# Patient Record
Sex: Male | Born: 1970 | Race: Black or African American | Hispanic: No | Marital: Married | Smoking: Former smoker
Health system: Southern US, Community
[De-identification: ages and names within clinical notes are randomized; demographics above are authoritative.]

## PROBLEM LIST (undated history)

## (undated) DIAGNOSIS — M549 Dorsalgia, unspecified: Secondary | ICD-10-CM

## (undated) DIAGNOSIS — I10 Essential (primary) hypertension: Secondary | ICD-10-CM

## (undated) DIAGNOSIS — E669 Obesity, unspecified: Secondary | ICD-10-CM

## (undated) DIAGNOSIS — L509 Urticaria, unspecified: Secondary | ICD-10-CM

## (undated) DIAGNOSIS — G4730 Sleep apnea, unspecified: Secondary | ICD-10-CM

## (undated) DIAGNOSIS — R7303 Prediabetes: Secondary | ICD-10-CM

## (undated) DIAGNOSIS — N289 Disorder of kidney and ureter, unspecified: Secondary | ICD-10-CM

## (undated) DIAGNOSIS — R0602 Shortness of breath: Secondary | ICD-10-CM

## (undated) DIAGNOSIS — E785 Hyperlipidemia, unspecified: Secondary | ICD-10-CM

## (undated) HISTORY — PX: TONSILLECTOMY: SUR1361

## (undated) HISTORY — DX: Prediabetes: R73.03

## (undated) HISTORY — DX: Disorder of kidney and ureter, unspecified: N28.9

## (undated) HISTORY — DX: Sleep apnea, unspecified: G47.30

## (undated) HISTORY — DX: Urticaria, unspecified: L50.9

## (undated) HISTORY — DX: Dorsalgia, unspecified: M54.9

## (undated) HISTORY — DX: Shortness of breath: R06.02

---

## 2003-12-22 ENCOUNTER — Emergency Department: Payer: Self-pay | Admitting: Emergency Medicine

## 2003-12-24 ENCOUNTER — Emergency Department: Payer: Self-pay | Admitting: Emergency Medicine

## 2006-03-29 ENCOUNTER — Emergency Department (HOSPITAL_COMMUNITY): Admission: EM | Admit: 2006-03-29 | Discharge: 2006-03-30 | Payer: Self-pay | Admitting: Emergency Medicine

## 2013-01-24 ENCOUNTER — Encounter (HOSPITAL_COMMUNITY): Payer: Self-pay | Admitting: Emergency Medicine

## 2013-01-24 ENCOUNTER — Emergency Department (INDEPENDENT_AMBULATORY_CARE_PROVIDER_SITE_OTHER)
Admission: EM | Admit: 2013-01-24 | Discharge: 2013-01-24 | Disposition: A | Discharge status: Home / Self Care | Payer: BC Managed Care – PPO | Source: Home / Self Care

## 2013-01-24 DIAGNOSIS — R059 Cough, unspecified: Secondary | ICD-10-CM

## 2013-01-24 DIAGNOSIS — R05 Cough: Secondary | ICD-10-CM

## 2013-01-24 DIAGNOSIS — J069 Acute upper respiratory infection, unspecified: Secondary | ICD-10-CM

## 2013-01-24 HISTORY — DX: Essential (primary) hypertension: I10

## 2013-01-24 MED ORDER — GUAIFENESIN-CODEINE 100-10 MG/5ML PO SYRP
5.0000 mL | ORAL_SOLUTION | ORAL | Status: DC | PRN
Start: 2013-01-24 — End: 2015-04-21

## 2013-01-24 NOTE — ED Provider Notes (Signed)
Medical screening examination/treatment/procedure(s) were performed by resident physician or non-physician practitioner and as supervising physician I was immediately available for consultation/collaboration.   Madelline Eshbach DOUGLAS MD.   Britany Callicott D Merrit Friesen, MD 01/24/13 1424 

## 2013-01-24 NOTE — ED Provider Notes (Signed)
CSN: [id number]     Arrival date & time 01/24/13  1042 History   First MD Initiated Contact with Patient 01/24/13 1153     Chief Complaint  Patient presents with  . Cough   (Consider location/radiation/quality/duration/timing/severity/associated sxs/prior Treatment) HPI Comments: Cough for 1 week   Past Medical History  Diagnosis Date  . Hypertension    History reviewed. No pertinent past surgical history. History reviewed. No pertinent family history. History  Substance Use Topics  . Smoking status: Never Smoker   . Smokeless tobacco: Not on file  . Alcohol Use: Not on file    Review of Systems  Constitutional: Negative for fever, diaphoresis, activity change and fatigue.  HENT: Positive for postnasal drip and trouble swallowing. Negative for ear pain, facial swelling, rhinorrhea and sore throat.   Eyes: Negative for pain, discharge and redness.  Respiratory: Positive for cough. Negative for chest tightness and shortness of breath.        Rib soreness with cough  Cardiovascular: Negative.   Gastrointestinal: Negative.   Musculoskeletal: Negative.  Negative for neck pain and neck stiffness.  Neurological: Negative.     Allergies  Review of patient's allergies indicates no known allergies.  Home Medications   Current Outpatient Rx  Name  Route  Sig  Dispense  Refill  . guaiFENesin-codeine (CHERATUSSIN AC) 100-10 MG/5ML syrup   Oral   Take 5 mLs by mouth every 4 (four) hours as needed for cough or congestion.   120 mL   0    BP 127/93  Pulse 80  Temp(Src) 98.4 F (36.9 C) (Oral)  Resp 18  SpO2 96% Physical Exam  Nursing note and vitals reviewed. Constitutional: He is oriented to person, place, and time. He appears well-developed and well-nourished. No distress.  HENT:  Mild cobblestoning and PND  Eyes: Conjunctivae and EOM are normal.  Neck: Normal range of motion. Neck supple.  Cardiovascular: Normal rate, regular rhythm and normal heart sounds.    Pulmonary/Chest: Effort normal and breath sounds normal. No respiratory distress. He has no wheezes.  Musculoskeletal: Normal range of motion. He exhibits no edema.  Lymphadenopathy:    He has no cervical adenopathy.  Neurological: He is alert and oriented to person, place, and time.  Skin: Skin is warm and dry. No rash noted.  Psychiatric: He has a normal mood and affect.    ED Course  Procedures (including critical care time) Labs Review Labs Reviewed - No data to display Imaging Review No results found.    MDM   1. Cough   2. URI (upper respiratory infection)       Cheratussin as dir claritin or allegra fluids    Hayden Rasmussen, NP 01/24/13 1218

## 2013-01-24 NOTE — ED Notes (Signed)
Cough for 1 week; NAD at present

## 2014-06-13 ENCOUNTER — Encounter (HOSPITAL_COMMUNITY): Payer: Self-pay | Admitting: Emergency Medicine

## 2014-06-13 ENCOUNTER — Emergency Department (HOSPITAL_COMMUNITY): Payer: 59

## 2014-06-13 ENCOUNTER — Emergency Department (HOSPITAL_COMMUNITY)
Admission: EM | Admit: 2014-06-13 | Discharge: 2014-06-13 | Disposition: A | Discharge status: Home / Self Care | Payer: 59 | Attending: Emergency Medicine | Admitting: Emergency Medicine

## 2014-06-13 DIAGNOSIS — R1110 Vomiting, unspecified: Secondary | ICD-10-CM | POA: Insufficient documentation

## 2014-06-13 DIAGNOSIS — E669 Obesity, unspecified: Secondary | ICD-10-CM | POA: Diagnosis not present

## 2014-06-13 DIAGNOSIS — I10 Essential (primary) hypertension: Secondary | ICD-10-CM | POA: Diagnosis not present

## 2014-06-13 DIAGNOSIS — R0602 Shortness of breath: Secondary | ICD-10-CM | POA: Diagnosis present

## 2014-06-13 DIAGNOSIS — J302 Other seasonal allergic rhinitis: Secondary | ICD-10-CM | POA: Diagnosis not present

## 2014-06-13 HISTORY — DX: Obesity, unspecified: E66.9

## 2014-06-13 LAB — COMPREHENSIVE METABOLIC PANEL
ALT: 18 U/L (ref 17–63)
ANION GAP: 11 (ref 5–15)
AST: 15 U/L (ref 15–41)
Albumin: 3.5 g/dL (ref 3.5–5.0)
Alkaline Phosphatase: 48 U/L (ref 38–126)
BILIRUBIN TOTAL: 1 mg/dL (ref 0.3–1.2)
BUN: 17 mg/dL (ref 6–20)
CO2: 26 mmol/L (ref 22–32)
CREATININE: 1.39 mg/dL — AB (ref 0.61–1.24)
Calcium: 9.1 mg/dL (ref 8.9–10.3)
Chloride: 102 mmol/L (ref 101–111)
GFR calc Af Amer: >60 mL/min (ref >60)
GFR calc non Af Amer: >60 mL/min (ref >60)
Glucose, Bld: 114 mg/dL — ABNORMAL HIGH (ref 70–99)
POTASSIUM: 3.9 mmol/L (ref 3.5–5.1)
SODIUM: 139 mmol/L (ref 135–145)
Total Protein: 6.9 g/dL (ref 6.5–8.1)

## 2014-06-13 LAB — CBC WITH DIFFERENTIAL/PLATELET
BASOS ABS: 0 10*3/uL (ref 0.0–0.1)
BASOS PCT: 0 % (ref 0–1)
Eosinophils Absolute: 0.2 10*3/uL (ref 0.0–0.7)
Eosinophils Relative: 3 % (ref 0–5)
HEMATOCRIT: 44.9 % (ref 39.0–52.0)
Hemoglobin: 14.9 g/dL (ref 13.0–17.0)
LYMPHS PCT: 30 % (ref 12–46)
Lymphs Abs: 1.8 10*3/uL (ref 0.7–4.0)
MCH: 27.7 pg (ref 26.0–34.0)
MCHC: 33.2 g/dL (ref 30.0–36.0)
MCV: 83.6 fL (ref 78.0–100.0)
MONO ABS: 0.7 10*3/uL (ref 0.1–1.0)
Monocytes Relative: 11 % (ref 3–12)
Neutro Abs: 3.5 10*3/uL (ref 1.7–7.7)
Neutrophils Relative %: 56 % (ref 43–77)
Platelets: 226 10*3/uL (ref 150–400)
RBC: 5.37 MIL/uL (ref 4.22–5.81)
RDW: 13.4 % (ref 11.5–15.5)
WBC: 6.2 10*3/uL (ref 4.0–10.5)

## 2014-06-13 LAB — URINALYSIS, ROUTINE W REFLEX MICROSCOPIC
Bilirubin Urine: NEGATIVE
Glucose, UA: NEGATIVE mg/dL
Hgb urine dipstick: NEGATIVE
KETONES UR: NEGATIVE mg/dL
Leukocytes, UA: NEGATIVE
NITRITE: NEGATIVE
PH: 6.5 (ref 5.0–8.0)
Protein, ur: NEGATIVE mg/dL
Specific Gravity, Urine: 1.013 (ref 1.005–1.030)
Urobilinogen, UA: 1 mg/dL (ref 0.0–1.0)

## 2014-06-13 LAB — I-STAT TROPONIN, ED: Troponin i, poc: 0 ng/mL (ref 0.00–0.08)

## 2014-06-13 LAB — TROPONIN I

## 2014-06-13 MED ORDER — CLONIDINE HCL 0.1 MG PO TABS
0.1000 mg | ORAL_TABLET | Freq: Once | ORAL | Status: AC
  Administered 2014-06-13: 0.1 mg via ORAL
  Filled 2014-06-13: qty 1

## 2014-06-13 MED ORDER — IRBESARTAN 300 MG PO TABS
300.0000 mg | ORAL_TABLET | Freq: Once | ORAL | Status: AC
  Administered 2014-06-13: 300 mg via ORAL
  Filled 2014-06-13: qty 1

## 2014-06-13 MED ORDER — HYDRALAZINE HCL 20 MG/ML IJ SOLN
10.0000 mg | Freq: Once | INTRAMUSCULAR | Status: AC
  Administered 2014-06-13: 10 mg via INTRAVENOUS
  Filled 2014-06-13: qty 1

## 2014-06-13 MED ORDER — AMLODIPINE BESYLATE 5 MG PO TABS
5.0000 mg | ORAL_TABLET | Freq: Once | ORAL | Status: AC
  Administered 2014-06-13: 5 mg via ORAL
  Filled 2014-06-13: qty 1

## 2014-06-13 MED ORDER — HYDROCHLOROTHIAZIDE 12.5 MG PO CAPS
12.5000 mg | ORAL_CAPSULE | Freq: Once | ORAL | Status: AC
  Administered 2014-06-13: 12.5 mg via ORAL
  Filled 2014-06-13: qty 1

## 2014-06-13 MED ORDER — HYDRALAZINE HCL 20 MG/ML IJ SOLN
20.0000 mg | Freq: Once | INTRAMUSCULAR | Status: AC
  Administered 2014-06-13: 20 mg via INTRAVENOUS
  Filled 2014-06-13: qty 1

## 2014-06-13 MED ORDER — CLONIDINE HCL 0.1 MG PO TABS: 0.1000 mg | ORAL_TABLET | Freq: Every day | ORAL | Status: DC

## 2014-06-13 MED ORDER — FLUTICASONE PROPIONATE 50 MCG/ACT NA SUSP
2.0000 | Freq: Every day | NASAL | Status: DC
Start: 2014-06-13 — End: 2014-06-27

## 2014-06-13 MED ORDER — OLMESARTAN MEDOXOMIL 40 MG PO TABS: 40.0000 mg | ORAL_TABLET | Freq: Every day | ORAL | Status: DC

## 2014-06-13 MED ORDER — HYDROCHLOROTHIAZIDE 12.5 MG PO CAPS: 12.5000 mg | ORAL_CAPSULE | Freq: Every day | ORAL | Status: DC

## 2014-06-13 MED ORDER — AMLODIPINE BESYLATE 5 MG PO TABS: 5.0000 mg | ORAL_TABLET | Freq: Once | ORAL | Status: DC

## 2014-06-13 NOTE — ED Notes (Signed)
Pt. reports SOB , emesis and throat tightness this morning , denies chest pain , hypertensive at triage with no antihypertensive medications for several months.

## 2014-06-13 NOTE — Discharge Instructions (Signed)
Hypertension °Hypertension, commonly called high blood pressure, is when the force of blood pumping through your arteries is too strong. Your arteries are the blood vessels that carry blood from your heart throughout your body. A blood pressure reading consists of a higher number over a lower number, such as 110/72. The higher number (systolic) is the pressure inside your arteries when your heart pumps. The lower number (diastolic) is the pressure inside your arteries when your heart relaxes. Ideally you want your blood pressure below 120/80. °Hypertension forces your heart to work harder to pump blood. Your arteries may become narrow or stiff. Having hypertension puts you at risk for heart disease, stroke, and other problems.  °RISK FACTORS °Some risk factors for high blood pressure are controllable. Others are not.  °Risk factors you cannot control include:  °· Race. You may be at higher risk if you are African American. °· Age. Risk increases with age. °· Gender. Men are at higher risk than women before age 45 years. After age 65, women are at higher risk than men. °Risk factors you can control include: °· Not getting enough exercise or physical activity. °· Being overweight. °· Getting too much fat, sugar, calories, or salt in your diet. °· Drinking too much alcohol. °SIGNS AND SYMPTOMS °Hypertension does not usually cause signs or symptoms. Extremely high blood pressure (hypertensive crisis) may cause headache, anxiety, shortness of breath, and nosebleed. °DIAGNOSIS  °To check if you have hypertension, your health care provider will measure your blood pressure while you are seated, with your arm held at the level of your heart. It should be measured at least twice using the same arm. Certain conditions can cause a difference in blood pressure between your right and left arms. A blood pressure reading that is higher than normal on one occasion does not mean that you need treatment. If one blood pressure reading  is high, ask your health care provider about having it checked again. °TREATMENT  °Treating high blood pressure includes making lifestyle changes and possibly taking medicine. Living a healthy lifestyle can help lower high blood pressure. You may need to change some of your habits. °Lifestyle changes may include: °· Following the DASH diet. This diet is high in fruits, vegetables, and whole grains. It is low in salt, red meat, and added sugars. °· Getting at least 2½ hours of brisk physical activity every week. °· Losing weight if necessary. °· Not smoking. °· Limiting alcoholic beverages. °· Learning ways to reduce stress. ° If lifestyle changes are not enough to get your blood pressure under control, your health care provider may prescribe medicine. You may need to take more than one. Work closely with your health care provider to understand the risks and benefits. °HOME CARE INSTRUCTIONS °· Have your blood pressure rechecked as directed by your health care provider.   °· Take medicines only as directed by your health care provider. Follow the directions carefully. Blood pressure medicines must be taken as prescribed. The medicine does not work as well when you skip doses. Skipping doses also puts you at risk for problems.   °· Do not smoke.   °· Monitor your blood pressure at home as directed by your health care provider.  °SEEK MEDICAL CARE IF:  °· You think you are having a reaction to medicines taken. °· You have recurrent headaches or feel dizzy. °· You have swelling in your ankles. °· You have trouble with your vision. °SEEK IMMEDIATE MEDICAL CARE IF: °· You develop a severe headache or confusion. °·   You have unusual weakness, numbness, or feel faint.  You have severe chest or abdominal pain.  You vomit repeatedly.  You have trouble breathing. MAKE SURE YOU:   Understand these instructions.  Will watch your condition.  Will get help right away if you are not doing well or get worse. Document  Released: 01/19/2005 Document Revised: 06/05/2013 Document Reviewed: 11/11/2012 [facility]ExitCare Patient Information 2015 [place], [place]. This information is not intended to replace advice given to you by your health care provider. Make sure you discuss any questions you have with your health care provider.  Shortness of Breath Shortness of breath means you have trouble breathing. It could also mean that you have a medical problem. You should get immediate medical care for shortness of breath. CAUSES   Not enough oxygen in the air such as with high altitudes or a smoke-filled room.  Certain lung diseases, infections, or problems.  Heart disease or conditions, such as angina or heart failure.  Low red blood cells (anemia).  Poor physical fitness, which can cause shortness of breath when you exercise.  Chest or back injuries or stiffness.  Being overweight.  Smoking.  Anxiety, which can make you feel like you are not getting enough air. DIAGNOSIS  Serious medical problems can often be found during your physical exam. Tests may also be done to determine why you are having shortness of breath. Tests may include:  Chest X-rays.  Lung function tests.  Blood tests.  An electrocardiogram (ECG).  An ambulatory electrocardiogram. An ambulatory ECG records your heartbeat patterns over a 24-hour period.  Exercise testing.  A transthoracic echocardiogram (TTE). During echocardiography, sound waves are used to evaluate how blood flows through your heart.  A transesophageal echocardiogram (TEE).  Imaging scans. Your health care provider may not be able to find a cause for your shortness of breath after your exam. In this case, it is important to have a follow-up exam with your health care provider as directed.  TREATMENT  Treatment for shortness of breath depends on the cause of your symptoms and can vary greatly. HOME CARE INSTRUCTIONS   Do not smoke. Smoking is a common cause of  shortness of breath. If you smoke, ask for help to quit.  Avoid being around chemicals or things that may bother your breathing, such as paint fumes and dust.  Rest as needed. Slowly resume your usual activities.  If medicines were prescribed, take them as directed for the full length of time directed. This includes oxygen and any inhaled medicines.  Keep all follow-up appointments as directed by your health care provider. SEEK MEDICAL CARE IF:   Your condition does not improve in the time expected.  You have a hard time doing your normal activities even with rest.  You have any new symptoms. SEEK IMMEDIATE MEDICAL CARE IF:   Your shortness of breath gets worse.  You feel light-headed, faint, or develop a cough not controlled with medicines.  You start coughing up blood.  You have pain with breathing.  You have chest pain or pain in your arms, shoulders, or abdomen.  You have a fever.  You are unable to walk up stairs or exercise the way you normally do. MAKE SURE YOU:  Understand these instructions.  Will watch your condition.  Will get help right away if you are not doing well or get worse. Document Released: 10/14/2000 Document Revised: 01/24/2013 Document Reviewed: 04/06/2011 [facility] Patient Information 2015 [place], [place]. This information is not intended to replace advice  given to you by your health care provider. Make sure you discuss any questions you have with your health care provider.   Allergic Rhinitis Allergic rhinitis is when the mucous membranes in the nose respond to allergens. Allergens are particles in the air that cause your body to have an allergic reaction. This causes you to release allergic antibodies. Through a chain of events, these eventually cause you to release histamine into the blood stream. Although meant to protect the body, it is this release of histamine that causes your discomfort, such as frequent sneezing, congestion, and an itchy,  runny nose.  CAUSES  Seasonal allergic rhinitis (hay fever) is caused by pollen allergens that may come from grasses, trees, and weeds. Year-round allergic rhinitis (perennial allergic rhinitis) is caused by allergens such as house dust mites, pet dander, and mold spores.  SYMPTOMS   Nasal stuffiness (congestion).  Itchy, runny nose with sneezing and tearing of the eyes. DIAGNOSIS  Your health care provider can help you determine the allergen or allergens that trigger your symptoms. If you and your health care provider are unable to determine the allergen, skin or blood testing may be used. TREATMENT  Allergic rhinitis does not have a cure, but it can be controlled by:  Medicines and allergy shots (immunotherapy).  Avoiding the allergen. Hay fever may often be treated with antihistamines in pill or nasal spray forms. Antihistamines block the effects of histamine. There are over-the-counter medicines that may help with nasal congestion and swelling around the eyes. Check with your health care provider before taking or giving this medicine.  If avoiding the allergen or the medicine prescribed do not work, there are many new medicines your health care provider can prescribe. Stronger medicine may be used if initial measures are ineffective. Desensitizing injections can be used if medicine and avoidance does not work. Desensitization is when a patient is given ongoing shots until the body becomes less sensitive to the allergen. Make sure you follow up with your health care provider if problems continue. HOME CARE INSTRUCTIONS It is not possible to completely avoid allergens, but you can reduce your symptoms by taking steps to limit your exposure to them. It helps to know exactly what you are allergic to so that you can avoid your specific triggers. SEEK MEDICAL CARE IF:   You have a fever.  You develop a cough that does not stop easily (persistent).  You have shortness of breath.  You start  wheezing.  Symptoms interfere with normal daily activities. Document Released: 10/14/2000 Document Revised: 01/24/2013 Document Reviewed: 09/26/2012 [facility]ExitCare Patient Information 2015 [place], [place]. This information is not intended to replace advice given to you by your health care provider. Make sure you discuss any questions you have with your health care provider.

## 2014-06-13 NOTE — ED Provider Notes (Signed)
TIME SEEN: 7:55 AM  CHIEF COMPLAINT: Throat pain, shortness of breath  HPI: Pt is a 44 y.o. male with history of obesity, hypertension who presents to the emergency department with an episode where he woke up from sleep around 5:30 this morning feeling like his throat was "constricted" and that he could not catch his breath. He states that this caused him to panic making his symptoms worse. He has never had anything like this before. States he's never told he has sleep apnea but does snore very loudly. He was sleeping lying flat on the couch when this happened. He states that he thought he may be having an allergic reaction and took Benadryl and is now feeling better. He states that now his throat feels "swollen" and is worse with swallowing. Denies any head any chest pain or chest discomfort. He did have one episode of emesis with this. No diaphoresis or dizziness. He did have a cough 3 weeks ago but no recent cough or fever. States he was a previous smoker in college and smokes cigars but none recently. The family history of a grandmother with heart disease but this was in her 780s. Denies history of stress test or cardiac catheterization. Denies at this pain is exertional.   He is hypertensive in the emergency department. Has been off of his medication for several months. Was previously on olmesartan 40 mg, amlodipine 5 mg and HCTZ 12.5 mg.  PCP is Dr. Parke SimmersBland  ROS: See HPI Constitutional: no fever  Eyes: no drainage  ENT: no runny nose   Cardiovascular:  no chest pain  Resp: SOB  GI: no vomiting GU: no dysuria Integumentary: no rash  Allergy: no hives  Musculoskeletal: no leg swelling  Neurological: no slurred speech ROS otherwise negative  PAST MEDICAL HISTORY/PAST SURGICAL HISTORY:  Past Medical History  Diagnosis Date  . Hypertension   . Obesity     MEDICATIONS:  Prior to Admission medications   Medication Sig Start Date End Date Taking? Authorizing Provider   guaiFENesin-codeine (CHERATUSSIN AC) 100-10 MG/5ML syrup Take 5 mLs by mouth every 4 (four) hours as needed for cough or congestion. 01/24/13   Hayden Rasmussenavid Mabe, NP    ALLERGIES:  No Known Allergies  SOCIAL HISTORY:  History  Substance Use Topics  . Smoking status: Never Smoker   . Smokeless tobacco: Not on file  . Alcohol Use: No    FAMILY HISTORY: No family history on file.  EXAM: BP 165/125 mmHg  Pulse 93  Temp(Src) 97.5 F (36.4 C) (Oral)  Resp 14  Ht 6\' 2"  (1.88 m)  Wt 436 lb (197.768 kg)  BMI 55.96 kg/m2  SpO2 94% CONSTITUTIONAL: Alert and oriented and responds appropriately to questions. Well-appearing; well-nourished, obese HEAD: Normocephalic EYES: Conjunctivae clear, PERRL ENT: normal nose; no rhinorrhea; moist mucous membranes; pharynx without lesions noted, clear to yellow sinus drainage noted in the posterior oropharynx, no tonsillar hypertrophy or exudate, no uvular deviation, no trismus or drooling, no angioedema, normal phonation, no stridor, airway patent NECK: Supple, no meningismus, no LAD  CARD: RRR; S1 and S2 appreciated; no murmurs, no clicks, no rubs, no gallops RESP: Normal chest excursion without splinting or tachypnea; breath sounds clear and equal bilaterally; no wheezes, no rhonchi, no rales, no hypoxia or respiratory distress, speaking full sentences ABD/GI: Normal bowel sounds; non-distended; soft, non-tender, no rebound, no guarding, no peritoneal signs BACK:  The back appears normal and is non-tender to palpation, there is no CVA tenderness EXT: Normal ROM in all  joints; non-tender to palpation; no edema; normal capillary refill; no cyanosis, no calf tenderness or swelling    SKIN: Normal color for age and race; warm NEURO: Moves all extremities equally, sensation to light touch intact diffusely, cranial nerves II through XII intact PSYCH: The patient's mood and manner are appropriate. Grooming and personal hygiene are appropriate.  MEDICAL  DECISION MAKING: Patient here with an episode of shortness of breath, vomiting and throat tightness. He does have risk factors for ACS including age, hypertension and obesity but has an atypical story. Pain is worse with swallowing and better with Benadryl. His airway seems pain and he has no angioedema, tonsillar hypertrophy or exudate, uvular deviation and no lymphadenopathy. He is hypertensive in the emergency department. We'll restart his home medication. Check cardiac labs, chest x-ray. We'll continue to closely monitor. He reports feeling much better.  ED PROGRESS: 10:00 AM  Pt reports his symptoms are still gone. Blood pressure is improving after blood pressure medication. Labs unremarkable other than creatinine of 1.3 with normal BUN.  Negative troponin. Normal chest x-ray. I have a suspicion that his symptoms may have been caused by obstructive sleep apnea due to his morbid obesity. Plan is to repeat second troponin 3 hours after first set was drawn and if negative have him follow-up with his primary care provider. He is comfortable with this plan.  12:00 PM  Pt's second troponin is negative that he continues to be significantly hypertensive. We'll give second dose of IV hydralazine.  1:45 PM  Pt's pressure is now 160/95. This is after giving second dose of hydralazine and then clonidine as well as his home medications. We'll restart his home medication. He has a PCP for follow-up. Advised him to call to make an appointment for this week. Discussed strict return precautions. I feel that he likely has obstructive sleep apnea due to his obesity and will need a sleep study as an outpatient. He is also complaining of sinus drainage in the back of his throat likely from allergies. No sign of bacterial pharyngitis, deep space neck infection, peritonsillar abscess. We'll discharge with Flonase. Have advised him to avoid over-the-counter cough suppressants and decongestants at this can raise his blood  pressure. His urine shows no proteinuria or hematuria. He reports that he is still feeling well. I feel he is safe to be discharged home. Discussed return precautions. He verbalizes understanding and is comfortable with plan.   EKG Interpretation  Date/Time:  Wednesday Jun 13 2014 06:32:14 EDT Ventricular Rate:  90 PR Interval:  154 QRS Duration: 86 QT Interval:  392 QTC Calculation: 479 R Axis:   -18 Text Interpretation:  Normal sinus rhythm Cannot rule out Anterior infarct , age undetermined Abnormal ECG No old tracing to compare Confirmed by OTTER  MD, OLGA ([id number]) on 06/13/2014 6:34:46 AM          Layla MawKristen N Ward, DO 06/13/14 1350

## 2014-06-27 ENCOUNTER — Encounter (HOSPITAL_COMMUNITY): Payer: Self-pay | Admitting: Emergency Medicine

## 2014-06-27 ENCOUNTER — Emergency Department (INDEPENDENT_AMBULATORY_CARE_PROVIDER_SITE_OTHER)
Admission: EM | Admit: 2014-06-27 | Discharge: 2014-06-27 | Disposition: A | Discharge status: Home / Self Care | Payer: 59 | Source: Home / Self Care | Attending: Family Medicine | Admitting: Family Medicine

## 2014-06-27 DIAGNOSIS — F458 Other somatoform disorders: Secondary | ICD-10-CM | POA: Diagnosis not present

## 2014-06-27 DIAGNOSIS — IMO0001 Reserved for inherently not codable concepts without codable children: Secondary | ICD-10-CM

## 2014-06-27 DIAGNOSIS — K219 Gastro-esophageal reflux disease without esophagitis: Secondary | ICD-10-CM

## 2014-06-27 DIAGNOSIS — R0989 Other specified symptoms and signs involving the circulatory and respiratory systems: Secondary | ICD-10-CM

## 2014-06-27 DIAGNOSIS — R09A2 Foreign body sensation, throat: Secondary | ICD-10-CM

## 2014-06-27 MED ORDER — OMEPRAZOLE 40 MG PO CPDR: 40.0000 mg | DELAYED_RELEASE_CAPSULE | Freq: Every day | ORAL | Status: DC

## 2014-06-27 NOTE — ED Provider Notes (Signed)
Gerald Colon is a 44 y.o. male who presents to Urgent Care today for gagging. Patient has a persistent symptom is this something is stuck in his throat. He is able to eat and drink normally. He feels a gagging sensation occasionally. This is been ongoing for a few weeks now. He was seen in the emergency room on the 11th and found to be significantly hypertensive. He was started on multiple different blood pressure medications as well as Flonase nasal spray and 20 mg of omeprazole daily. With this helped a little. He denies any chest pains or palpitations. He notes a regurgitation-type feeling occasionally.   Past Medical History  Diagnosis Date  . Hypertension   . Obesity    History reviewed. No pertinent past surgical history. History  Substance Use Topics  . Smoking status: Never Smoker   . Smokeless tobacco: Not on file  . Alcohol Use: No   ROS as above Medications: No current facility-administered medications for this encounter.   Current Outpatient Prescriptions  Medication Sig Dispense Refill  . amLODipine (NORVASC) 5 MG tablet Take 1 tablet (5 mg total) by mouth once. 30 tablet 1  . cloNIDine (CATAPRES) 0.1 MG tablet Take 1 tablet (0.1 mg total) by mouth daily. 30 tablet 1  . guaiFENesin-codeine (CHERATUSSIN AC) 100-10 MG/5ML syrup Take 5 mLs by mouth every 4 (four) hours as needed for cough or congestion. (Patient not taking: Reported on 06/13/2014) 120 mL 0  . hydrochlorothiazide (MICROZIDE) 12.5 MG capsule Take 1 capsule (12.5 mg total) by mouth daily. 30 capsule 1  . olmesartan (BENICAR) 40 MG tablet Take 1 tablet (40 mg total) by mouth daily. 30 tablet 1  . Olmesartan-Amlodipine-HCTZ 40-5-12.5 MG TABS Take 1 tablet by mouth daily.    Marland Kitchen. omeprazole (PRILOSEC) 40 MG capsule Take 1 capsule (40 mg total) by mouth daily. 30 capsule 1  . [DISCONTINUED] fluticasone (FLONASE) 50 MCG/ACT nasal spray Place 2 sprays into both nostrils daily. 16 g 0   No Known Allergies   Exam:  BP  136/76 mmHg  Pulse 82  Temp(Src) 98.1 F (36.7 C) (Oral)  Resp 20  SpO2 98% Gen: Well NAD morbidly obese HEENT: EOMI,  MMM posterior pharynx with cobblestoning. Normal tympanic membranes bilaterally. Large neck Lungs: Normal work of breathing. CTABL Heart: RRR no MRG Abd: NABS, Soft. Nondistended, Nontender Exts: Brisk capillary refill, warm and well perfused.   No results found for this or any previous visit (from the past 24 hour(s)). No results found.  Assessment and Plan: 44 y.o. male with globus sensation likely. Suspect acid reflux. Increase omeprazole to 40 mg daily. Switch from Flonase to Nasonex or Rhinocort. Follow-up with PCP. He may benefit from sleep study and upper endoscopy.  Discussed warning signs or symptoms. Please see discharge instructions. Patient expresses understanding.     Rodolph BongEvan S Abhay Godbolt, MD 06/27/14 1901

## 2014-06-27 NOTE — Discharge Instructions (Signed)
Thank you for coming in today. STOP flonase. Start rhinocort or nasonex. Use omeprazole 40mg .  Follow up with Dr. Parke SimmersBland.  Return as needed.   Globus Syndrome Globus Syndrome is a feeling of a lump or a sensation of something caught in your throat. Eating food or drinking fluids does not seem to get rid of it. Yet it is not noticeable during the actual act of swallowing food or liquids. Usually there is nothing physically wrong. It is troublesome because it is an unpleasant sensation which is sometimes difficult to ignore and at times may seem to worsen. The syndrome is quite common. It is estimated 45% of the population experiences features of the condition at some stage during their lives. The symptoms are usually temporary. The largest group of people who feel the need to seek medical treatment is females between the ages of 4730 to 660.  CAUSES  Globus Syndrome appears to be triggered by or aggravated by stress, anxiety and depression.  Tension related to stress could product abnormal muscle spasms in the esophagus which would account for the sensation of a lump or ball in your throat.  Frequent swallowing or drying of the throat caused by anxiety or other strong emotions can also produce this uncomfortable sensation in your throat.  Fear and sadness can be expressed by the body in many ways. For instance, if you had a relative with throat cancer you might become overly concerned about your own health and develop uncomfortable sensations in your throat.  The reaction to a crisis or a trauma event in your life can take the form of a lump in your throat. It is as if you are indirectly saying you can not handle or "swallow" one more thing. DIAGNOSIS  Usually your caregiver will know what is wrong by talking to you and examining you. If the condition persists for several days, more testing may be done to make sure there is not another problem present. This is usually not the case. TREATMENT    Reassurance is often the best treatment available. Usually the problem leaves without treatment over several days.  Sometimes anti-anxiety medications may be prescribed.  Counseling or talk therapy can also help with strong underlying emotions.  Note that in most cases this is not something that keeps coming back and you should not be concerned or worried. Document Released: 04/11/2003 Document Revised: 04/13/2011 Document Reviewed: 09/08/2007 [facility] Patient Information 2015 [place], [place]. This information is not intended to replace advice given to you by your health care provider. Make sure you discuss any questions you have with your health care provider.

## 2014-06-27 NOTE — ED Notes (Signed)
See physician note

## 2015-01-02 ENCOUNTER — Encounter (HOSPITAL_COMMUNITY): Payer: Self-pay | Admitting: Emergency Medicine

## 2015-01-02 ENCOUNTER — Emergency Department (HOSPITAL_COMMUNITY)
Admission: EM | Admit: 2015-01-02 | Discharge: 2015-01-02 | Disposition: A | Discharge status: Home / Self Care | Payer: 59 | Source: Home / Self Care

## 2015-01-02 DIAGNOSIS — R52 Pain, unspecified: Secondary | ICD-10-CM | POA: Diagnosis not present

## 2015-01-02 DIAGNOSIS — K0889 Other specified disorders of teeth and supporting structures: Secondary | ICD-10-CM

## 2015-01-02 MED ORDER — CLINDAMYCIN HCL 150 MG PO CAPS: 150.0000 mg | ORAL_CAPSULE | Freq: Three times a day (TID) | ORAL | Status: DC

## 2015-01-02 MED ORDER — HYDROCODONE-ACETAMINOPHEN 5-325 MG PO TABS: 2.0000 | ORAL_TABLET | Freq: Four times a day (QID) | ORAL | Status: DC | PRN

## 2015-01-02 NOTE — ED Notes (Signed)
Dental swelling and feeling bad

## 2015-01-02 NOTE — ED Provider Notes (Signed)
CSN: [id number]     Arrival date & time 01/02/15  1904 History   None    No chief complaint on file.  (Consider location/radiation/quality/duration/timing/severity/associated sxs/prior Treatment) HPI History obtained from patient:   LOCATION: body aches SEVERITY:2 DURATION:2 days CONTEXT:symptoms getting better, sudden onset 2 days ago QUALITY:ache MODIFYING FACTORS:OTC meds ASSOCIATED SYMPTOMS:right lower tooth pain TIMING:resolving OCCUPATION:graphic designer  Past Medical History  Diagnosis Date  . Hypertension   . Obesity    History reviewed. No pertinent past surgical history. No family history on file. Social History  Substance Use Topics  . Smoking status: Never Smoker   . Smokeless tobacco: None  . Alcohol Use: No    Review of Systems ROS +'ve tooth pain, body aches  Denies: HEADACHE, NAUSEA, ABDOMINAL PAIN, CHEST PAIN, CONGESTION, DYSURIA, SHORTNESS OF BREATH  Allergies  Review of patient's allergies indicates no known allergies.  Home Medications   Prior to Admission medications   Medication Sig Start Date End Date Taking? Authorizing Provider  amLODipine (NORVASC) 5 MG tablet Take 1 tablet (5 mg total) by mouth once. 06/13/14   Kristen N Ward, DO  clindamycin (CLEOCIN) 150 MG capsule Take 1 capsule (150 mg total) by mouth 3 (three) times daily. 01/02/15   Tharon Aquas, PA  cloNIDine (CATAPRES) 0.1 MG tablet Take 1 tablet (0.1 mg total) by mouth daily. 06/13/14   Kristen N Ward, DO  guaiFENesin-codeine (CHERATUSSIN AC) 100-10 MG/5ML syrup Take 5 mLs by mouth every 4 (four) hours as needed for cough or congestion. Patient not taking: Reported on 06/13/2014 01/24/13   Hayden Rasmussen, NP  hydrochlorothiazide (MICROZIDE) 12.5 MG capsule Take 1 capsule (12.5 mg total) by mouth daily. 06/13/14   Kristen N Ward, DO  HYDROcodone-acetaminophen (NORCO/VICODIN) 5-325 MG tablet Take 2 tablets by mouth every 6 (six) hours as needed. 01/02/15   Tharon Aquas, PA    olmesartan (BENICAR) 40 MG tablet Take 1 tablet (40 mg total) by mouth daily. 06/13/14   Kristen N Ward, DO  Olmesartan-Amlodipine-HCTZ 40-5-12.5 MG TABS Take 1 tablet by mouth daily.    Historical Provider, MD  omeprazole (PRILOSEC) 40 MG capsule Take 1 capsule (40 mg total) by mouth daily. 06/27/14   Rodolph Bong, MD   Meds Ordered and Administered this Visit  Medications - No data to display  BP 150/97 mmHg  Pulse 97  Temp(Src) 99.4 F (37.4 C) (Oral)  Resp 16  SpO2 95% No data found.   Physical Exam  Constitutional: He is oriented to person, place, and time. He appears well-developed and well-nourished.  HENT:  Head: Normocephalic and atraumatic.  Right Ear: External ear normal.  Left Ear: External ear normal.  Nose: Nose normal.  Mouth/Throat:    Eyes: Conjunctivae are normal.  Neck: Normal range of motion. Neck supple.  Pulmonary/Chest: Effort normal and breath sounds normal.  Abdominal: Soft. Bowel sounds are normal.  Musculoskeletal:       Lumbar back: He exhibits pain. He exhibits normal range of motion, no tenderness, no swelling and no spasm.       Back:  Lymphadenopathy:    He has cervical adenopathy.  Neurological: He is alert and oriented to person, place, and time.  Skin: Skin is warm and dry.  Psychiatric: He has a normal mood and affect. His behavior is normal. Judgment and thought content normal.  Nursing note and vitals reviewed.   ED Course  Procedures (including critical care time)  Labs Review Labs Reviewed - No data to display  Imaging Review No results found.   Visual Acuity Review  Right Eye Distance:   Left Eye Distance:   Bilateral Distance:    Right Eye Near:   Left Eye Near:    Bilateral Near:         MDM   1. Pain, dental   2. Body aches    There are no signs of flu. Most likely viral syndrome getting better.  Dental infection due to impacted tooth Rx. Clindamycin, Norco    Tharon AquasFrank C Yeriel Mineo, [place]PA 01/02/15 1956

## 2015-04-21 ENCOUNTER — Emergency Department (HOSPITAL_COMMUNITY): Payer: BLUE CROSS/BLUE SHIELD

## 2015-04-21 ENCOUNTER — Emergency Department (HOSPITAL_COMMUNITY)
Admission: EM | Admit: 2015-04-21 | Discharge: 2015-04-21 | Disposition: A | Discharge status: Home / Self Care | Payer: BLUE CROSS/BLUE SHIELD | Attending: Emergency Medicine | Admitting: Emergency Medicine

## 2015-04-21 ENCOUNTER — Encounter (HOSPITAL_COMMUNITY): Payer: Self-pay

## 2015-04-21 DIAGNOSIS — J3489 Other specified disorders of nose and nasal sinuses: Secondary | ICD-10-CM | POA: Insufficient documentation

## 2015-04-21 DIAGNOSIS — I10 Essential (primary) hypertension: Secondary | ICD-10-CM | POA: Diagnosis not present

## 2015-04-21 DIAGNOSIS — Z79899 Other long term (current) drug therapy: Secondary | ICD-10-CM | POA: Diagnosis not present

## 2015-04-21 DIAGNOSIS — R911 Solitary pulmonary nodule: Secondary | ICD-10-CM

## 2015-04-21 DIAGNOSIS — R079 Chest pain, unspecified: Secondary | ICD-10-CM | POA: Diagnosis present

## 2015-04-21 DIAGNOSIS — R05 Cough: Secondary | ICD-10-CM | POA: Diagnosis not present

## 2015-04-21 DIAGNOSIS — Z87891 Personal history of nicotine dependence: Secondary | ICD-10-CM | POA: Diagnosis not present

## 2015-04-21 DIAGNOSIS — R0789 Other chest pain: Secondary | ICD-10-CM | POA: Insufficient documentation

## 2015-04-21 DIAGNOSIS — R109 Unspecified abdominal pain: Secondary | ICD-10-CM | POA: Diagnosis not present

## 2015-04-21 HISTORY — DX: Hyperlipidemia, unspecified: E78.5

## 2015-04-21 LAB — D-DIMER, QUANTITATIVE: D-Dimer, Quant: <0.27 ug/mL-FEU (ref 0.00–0.50)

## 2015-04-21 LAB — CBC
HCT: 43.9 % (ref 39.0–52.0)
HEMOGLOBIN: 14.5 g/dL (ref 13.0–17.0)
MCH: 28.2 pg (ref 26.0–34.0)
MCHC: 33 g/dL (ref 30.0–36.0)
MCV: 85.2 fL (ref 78.0–100.0)
Platelets: 227 10*3/uL (ref 150–400)
RBC: 5.15 MIL/uL (ref 4.22–5.81)
RDW: 13.7 % (ref 11.5–15.5)
WBC: 8.5 10*3/uL (ref 4.0–10.5)

## 2015-04-21 LAB — BASIC METABOLIC PANEL WITH GFR
Anion gap: 12 (ref 5–15)
BUN: 20 mg/dL (ref 6–20)
CO2: 25 mmol/L (ref 22–32)
Calcium: 9.5 mg/dL (ref 8.9–10.3)
Chloride: 102 mmol/L (ref 101–111)
Creatinine, Ser: 1.23 mg/dL (ref 0.61–1.24)
GFR calc Af Amer: >60 mL/min
GFR calc non Af Amer: >60 mL/min
Glucose, Bld: 110 mg/dL — ABNORMAL HIGH (ref 65–99)
Potassium: 3.8 mmol/L (ref 3.5–5.1)
Sodium: 139 mmol/L (ref 135–145)

## 2015-04-21 LAB — TROPONIN I: Troponin I: <0.03 ng/mL (ref <0.031)

## 2015-04-21 LAB — I-STAT TROPONIN, ED: Troponin i, poc: 0 ng/mL (ref 0.00–0.08)

## 2015-04-21 MED ORDER — NAPROXEN 500 MG PO TABS: 500.0000 mg | ORAL_TABLET | Freq: Two times a day (BID) | ORAL | Status: DC

## 2015-04-21 MED ORDER — MORPHINE SULFATE (PF) 4 MG/ML IV SOLN
4.0000 mg | Freq: Once | INTRAVENOUS | Status: AC
  Administered 2015-04-21: 4 mg via INTRAVENOUS
  Filled 2015-04-21: qty 1

## 2015-04-21 MED ORDER — IOHEXOL 350 MG/ML SOLN
100.0000 mL | Freq: Once | INTRAVENOUS | Status: AC | PRN
  Administered 2015-04-21: 100 mL via INTRAVENOUS

## 2015-04-21 NOTE — ED Notes (Signed)
Per pt he is having left sided chest pain, started last night. Pt rates pain 8/10, describes pain as sharp.Pt states pain increases with deep breathing and coughing. Pt thinks he possibly pulled a muscle after lifting heavy objects last night. Pt has productive cough, mucus is brown and thick. Pt states that he took theraflu because he thought it was a chest cold.

## 2015-04-21 NOTE — ED Provider Notes (Signed)
CSN: [id number]     Arrival date & time 04/21/15  4098 History   First MD Initiated Contact with Patient 04/21/15 0701     Chief Complaint  Patient presents with  . Chest Pain     (Consider location/radiation/quality/duration/timing/severity/associated sxs/prior Treatment) HPI Comments: Patient presents with left-sided chest pain onset around midnight and has been constant. He denies any trauma or injury. He was lifting some boxes of water yesterday but did not have any injury at that time. Sharp and stabbing and radiates to his upper back. It is worse with deep breathing and movement of his left arm. He thought he may be strained muscle. He has had a cough productive of clear mucus over the past. He thought the pain might be from that. No history of heart problems. He has a history of hypertension and morbid obesity. Denies any history of blood clots. Denies any fevers or chills, abdominal pain, nausea or vomiting. No leg pain or leg swelling. He is not a smoker.  The history is provided by the patient.    Past Medical History  Diagnosis Date  . Hypertension   . Obesity   . Hyperlipemia    Past Surgical History  Procedure Laterality Date  . Tonsillectomy     No family history on file. Social History  Substance Use Topics  . Smoking status: Former Games developer  . Smokeless tobacco: Never Used  . Alcohol Use: Yes     Comment: occasionally     Review of Systems  Constitutional: Negative for fever, activity change, appetite change and fatigue.  HENT: Positive for congestion and rhinorrhea.   Respiratory: Positive for cough and chest tightness. Negative for shortness of breath.   Cardiovascular: Positive for chest pain.  Gastrointestinal: Negative for nausea, vomiting and abdominal pain.  Genitourinary: Negative for dysuria and hematuria.  Musculoskeletal: Negative for myalgias and arthralgias.  Neurological: Negative for dizziness, weakness, light-headedness and headaches.   A  complete 10 system review of systems was obtained and all systems are negative except as noted in the HPI and PMH.     Allergies  Review of patient's allergies indicates no known allergies.  Home Medications   Prior to Admission medications   Medication Sig Start Date End Date Taking? Authorizing Provider  amLODipine (NORVASC) 5 MG tablet Take 1 tablet (5 mg total) by mouth once. 06/13/14  Yes Kristen N Ward, DO  fexofenadine (ALLEGRA) 180 MG tablet Take 180 mg by mouth daily.   Yes Historical Provider, MD  Olmesartan-Amlodipine-HCTZ 40-5-12.5 MG TABS Take 1 tablet by mouth daily.   Yes Historical Provider, MD  naproxen (NAPROSYN) 500 MG tablet Take 1 tablet (500 mg total) by mouth 2 (two) times daily. 04/21/15   Glynn Octave, MD   BP 123/94 mmHg  Pulse 71  Temp(Src) 98.2 F (36.8 C) (Oral)  Resp 10  Ht 6\' 2"  (1.88 m)  Wt 412 lb (186.882 kg)  BMI 52.88 kg/m2  SpO2 99% Physical Exam  Constitutional: He is oriented to person, place, and time. He appears well-developed and well-nourished. No distress.  HENT:  Head: Normocephalic and atraumatic.  Mouth/Throat: Oropharynx is clear and moist. No oropharyngeal exudate.  Eyes: Conjunctivae and EOM are normal. Pupils are equal, round, and reactive to light.  Neck: Normal range of motion. Neck supple.  No meningismus.  Cardiovascular: Normal rate, regular rhythm, normal heart sounds and intact distal pulses.   No murmur heard. Pulmonary/Chest: Effort normal and breath sounds normal. No respiratory distress. He exhibits no  tenderness.  Chest wall is nontender Chest pain with inspiration and L arm movement.  Abdominal: Soft. There is no tenderness. There is no rebound and no guarding.  Musculoskeletal: Normal range of motion. He exhibits no edema or tenderness.  CP worse with L arm movement. Intact radial pulses and grip strengths  Neurological: He is alert and oriented to person, place, and time. No cranial nerve deficit. He exhibits  normal muscle tone. Coordination normal.  No ataxia on finger to nose bilaterally. No pronator drift. 5/5 strength throughout. CN 2-12 intact.Equal grip strength. Sensation intact.   Skin: Skin is warm.  Psychiatric: He has a normal mood and affect. His behavior is normal.  Nursing note and vitals reviewed.   ED Course  Procedures (including critical care time) Labs Review Labs Reviewed  BASIC METABOLIC PANEL - Abnormal; Notable for the following:    Glucose, Bld 110 (*)    All other components within normal limits  CBC  D-DIMER, QUANTITATIVE (NOT AT [facility])  TROPONIN I  Rosezena Sensor, ED    Imaging Review Dg Chest 2 View  04/21/2015  ADDENDUM REPORT: 04/21/2015 10:03 ADDENDUM: Nodular opacity seen on chest CT abutting the left major fissure is not appreciable by radiography. Electronically Signed   By: Bretta Bang III M.D.   On: 04/21/2015 10:03  04/21/2015  CLINICAL DATA:  Left-sided chest pain for 1 day.  Cough. EXAM: CHEST  2 VIEW COMPARISON:  Jun 13, 2014 FINDINGS: Lungs are clear. Heart size and pulmonary vascularity are normal. No adenopathy. No pneumothorax. No bone lesions. IMPRESSION: No edema or consolidation. Electronically Signed: By: Bretta Bang III M.D. On: 04/21/2015 07:44   Ct Angio Chest Aorta W/cm &/or Wo/cm  04/21/2015  CLINICAL DATA:  Acute onset chest and abdomen pain EXAM: CT ANGIOGRAPHY CHEST, ABDOMEN AND PELVIS TECHNIQUE: Initially, axial CT images were obtained through the chest without intravenous contrast material administration. Multidetector CT imaging through the chest, abdomen and pelvis was performed using the standard protocol during bolus administration of intravenous contrast. Multiplanar reconstructed images and MIPs were obtained and reviewed to evaluate the vascular anatomy. CONTRAST:  OMNIPAQUE IOHEXOL 350 MG/ML SOLN COMPARISON:  Chest radiograph April 21, 2015 FINDINGS: CTA CHEST FINDINGS There is no demonstrable thoracic aortic  aneurysm or dissection. The ascending aorta has a measured transverse diameter of 3.9 x 3.5 cm. Visualized great vessels appear normal. There is no demonstrable pulmonary embolus. Pericardium is not thickened. Visualized thyroid appears normal. There is no demonstrable thoracic adenopathy. There is a pulmonary nodular opacity in the superior segment of the left lower lobe immediately adjacent to the minor fissure measuring 2.2 x 1.9 cm. Lungs elsewhere are clear. There are no blastic or lytic bone lesions. Review of the MIP images confirms the above findings. CTA ABDOMEN AND PELVIS FINDINGS There is no abdominal aortic aneurysm or dissection. The celiac, superior mesenteric, and inferior mesenteric arteries are widely patent. Single renal arteries are widely patent bilaterally. There is no appreciable vessel narrowing in the major abdominal or pelvic arterial vessels. No aneurysm or dissection is noted in the pelvic arterial vessels. No focal liver lesions are identified. Gallbladder wall is not appreciably thickened. There is no biliary duct dilatation. No splenic lesions are identified. Pancreas and adrenals appear normal. Kidneys bilaterally show no mass or hydronephrosis on either side. There is no renal or ureteral calculus on either side. Urinary bladder is midline with wall thickness within normal limits. In the pelvis, there is no mass or fluid collection.  There are sigmoid diverticula without diverticulitis. There is no bowel obstruction. No free air or portal venous air. No bowel wall or mesenteric thickening. There is no ascites, adenopathy, or abscess in the abdomen or pelvis. There are no blastic or lytic bone lesions. Review of the MIP images confirms the above findings. IMPRESSION: CT angiogram chest: There is a 2.2 x 1.9 cm nodular opacity abutting the major fissure in the superior segment left lower lobe. This nodular opacity is concerning for potential neoplasm. Would advise correlation with  nuclear medicine PET study to further assess. Lungs elsewhere clear. There is no demonstrable thoracic aortic aneurysm or dissection. No adenopathy is demonstrable. No pulmonary embolus evident. CT angiogram abdomen and CT angiogram pelvis: No aneurysm or dissection in the abdominal or pelvic arterial vessels. No appreciable atherosclerotic change. No significant vessel narrowing. Scattered sigmoid diverticula without diverticulitis. No bowel obstruction. No abscess. No renal or ureteral calculus on either side. Study otherwise unremarkable. Electronically Signed   By: Bretta Bang III M.D.   On: 04/21/2015 09:54   Ct Cta Abd/pel W/cm &/or W/o Cm  04/21/2015  CLINICAL DATA:  Acute onset chest and abdomen pain EXAM: CT ANGIOGRAPHY CHEST, ABDOMEN AND PELVIS TECHNIQUE: Initially, axial CT images were obtained through the chest without intravenous contrast material administration. Multidetector CT imaging through the chest, abdomen and pelvis was performed using the standard protocol during bolus administration of intravenous contrast. Multiplanar reconstructed images and MIPs were obtained and reviewed to evaluate the vascular anatomy. CONTRAST:  OMNIPAQUE IOHEXOL 350 MG/ML SOLN COMPARISON:  Chest radiograph April 21, 2015 FINDINGS: CTA CHEST FINDINGS There is no demonstrable thoracic aortic aneurysm or dissection. The ascending aorta has a measured transverse diameter of 3.9 x 3.5 cm. Visualized great vessels appear normal. There is no demonstrable pulmonary embolus. Pericardium is not thickened. Visualized thyroid appears normal. There is no demonstrable thoracic adenopathy. There is a pulmonary nodular opacity in the superior segment of the left lower lobe immediately adjacent to the minor fissure measuring 2.2 x 1.9 cm. Lungs elsewhere are clear. There are no blastic or lytic bone lesions. Review of the MIP images confirms the above findings. CTA ABDOMEN AND PELVIS FINDINGS There is no abdominal  aortic aneurysm or dissection. The celiac, superior mesenteric, and inferior mesenteric arteries are widely patent. Single renal arteries are widely patent bilaterally. There is no appreciable vessel narrowing in the major abdominal or pelvic arterial vessels. No aneurysm or dissection is noted in the pelvic arterial vessels. No focal liver lesions are identified. Gallbladder wall is not appreciably thickened. There is no biliary duct dilatation. No splenic lesions are identified. Pancreas and adrenals appear normal. Kidneys bilaterally show no mass or hydronephrosis on either side. There is no renal or ureteral calculus on either side. Urinary bladder is midline with wall thickness within normal limits. In the pelvis, there is no mass or fluid collection. There are sigmoid diverticula without diverticulitis. There is no bowel obstruction. No free air or portal venous air. No bowel wall or mesenteric thickening. There is no ascites, adenopathy, or abscess in the abdomen or pelvis. There are no blastic or lytic bone lesions. Review of the MIP images confirms the above findings. IMPRESSION: CT angiogram chest: There is a 2.2 x 1.9 cm nodular opacity abutting the major fissure in the superior segment left lower lobe. This nodular opacity is concerning for potential neoplasm. Would advise correlation with nuclear medicine PET study to further assess. Lungs elsewhere clear. There is no demonstrable thoracic aortic  aneurysm or dissection. No adenopathy is demonstrable. No pulmonary embolus evident. CT angiogram abdomen and CT angiogram pelvis: No aneurysm or dissection in the abdominal or pelvic arterial vessels. No appreciable atherosclerotic change. No significant vessel narrowing. Scattered sigmoid diverticula without diverticulitis. No bowel obstruction. No abscess. No renal or ureteral calculus on either side. Study otherwise unremarkable. Electronically Signed   By: Bretta BangWilliam  Woodruff III M.D.   On: 04/21/2015 09:54    I have personally reviewed and evaluated these images and lab results as part of my medical decision-making.   EKG Interpretation   Date/Time:  Sunday April 21 2015 06:33:03 EDT Ventricular Rate:  79 PR Interval:  160 QRS Duration: 86 QT Interval:  390 QTC Calculation: 447 R Axis:   8 Text Interpretation:  Normal sinus rhythm Normal ECG No significant change  was found Confirmed by Manus GunningANCOUR  MD, Renard Caperton ([id number]) on 04/21/2015 7:01:13  AM      MDM   Final diagnoses:  Atypical chest pain  Lung nodule   constant left-sided chest pain since 12 AM. Worse with movement and worse with deep breathing. Also history of lifting. EKG normal sinus rhythm without acute change.  Doubt ACS. Consider PE, aortic dissection, pneumonia, pneumothorax. No hypoxia and lungs are clear.  CXR negative. D-dimer negative. Troponin negative.  Troponin negative 2. Low suspicion for ACS. Low suspicion for PE with negative d-dimer.  Patient informed of lung nodule need for follow-up PET scan. He is informed this could be something potentially malignant. No evidence of aortic dissection or aneurysm. No pulmonary embolism.  Pain appears to be musculoskeletal. Will treat with NSAIDs. Follow up with PCP, return precautions discussed.  Glynn OctaveStephen Gardy Montanari, MD 04/21/15 [phone]

## 2015-04-21 NOTE — ED Notes (Signed)
Patient transported to X-ray 

## 2015-04-21 NOTE — Discharge Instructions (Signed)
Nonspecific Chest Pain  There is no evidence of heart attack or blood clot in the lung. Follow-up with your doctor for a nuclear medicine PET scan to evaluate this lung nodule. Return to the ED if you develop new or worsening symptoms. Chest pain can be caused by many different conditions. There is always a chance that your pain could be related to something serious, such as a heart attack or a blood clot in your lungs. Chest pain can also be caused by conditions that are not life-threatening. If you have chest pain, it is very important to follow up with your health care provider. CAUSES  Chest pain can be caused by:  Heartburn.  Pneumonia or bronchitis.  Anxiety or stress.  Inflammation around your heart (pericarditis) or lung (pleuritis or pleurisy).  A blood clot in your lung.  A collapsed lung (pneumothorax). It can develop suddenly on its own (spontaneous pneumothorax) or from trauma to the chest.  Shingles infection (varicella-zoster virus).  Heart attack.  Damage to the bones, muscles, and cartilage that make up your chest wall. This can include:  Bruised bones due to injury.  Strained muscles or cartilage due to frequent or repeated coughing or overwork.  Fracture to one or more ribs.  Sore cartilage due to inflammation (costochondritis). RISK FACTORS  Risk factors for chest pain may include:  Activities that increase your risk for trauma or injury to your chest.  Respiratory infections or conditions that cause frequent coughing.  Medical conditions or overeating that can cause heartburn.  Heart disease or family history of heart disease.  Conditions or health behaviors that increase your risk of developing a blood clot.  Having had chicken pox (varicella zoster). SIGNS AND SYMPTOMS Chest pain can feel like:  Burning or tingling on the surface of your chest or deep in your chest.  Crushing, pressure, aching, or squeezing pain.  Dull or sharp pain that is  worse when you move, cough, or take a deep breath.  Pain that is also felt in your back, neck, shoulder, or arm, or pain that spreads to any of these areas. Your chest pain may come and go, or it may stay constant. DIAGNOSIS Lab tests or other studies may be needed to find the cause of your pain. Your health care provider may have you take a test called an ambulatory ECG (electrocardiogram). An ECG records your heartbeat patterns at the time the test is performed. You may also have other tests, such as:  Transthoracic echocardiogram (TTE). During echocardiography, sound waves are used to create a picture of all of the heart structures and to look at how blood flows through your heart.  Transesophageal echocardiogram (TEE).This is a more advanced imaging test that obtains images from inside your body. It allows your health care provider to see your heart in finer detail.  Cardiac monitoring. This allows your health care provider to monitor your heart rate and rhythm in real time.  Holter monitor. This is a portable device that records your heartbeat and can help to diagnose abnormal heartbeats. It allows your health care provider to track your heart activity for several days, if needed.  Stress tests. These can be done through exercise or by taking medicine that makes your heart beat more quickly.  Blood tests.  Imaging tests. TREATMENT  Your treatment depends on what is causing your chest pain. Treatment may include:  Medicines. These may include:  Acid blockers for heartburn.  Anti-inflammatory medicine.  Pain medicine for inflammatory conditions.  Antibiotic medicine, if an infection is present.  Medicines to dissolve blood clots.  Medicines to treat coronary artery disease.  Supportive care for conditions that do not require medicines. This may include:  Resting.  Applying heat or cold packs to injured areas.  Limiting activities until pain decreases. HOME CARE  INSTRUCTIONS  If you were prescribed an antibiotic medicine, finish it all even if you start to feel better.  Avoid any activities that bring on chest pain.  Do not use any tobacco products, including cigarettes, chewing tobacco, or electronic cigarettes. If you need help quitting, ask your health care provider.  Do not drink alcohol.  Take medicines only as directed by your health care provider.  Keep all follow-up visits as directed by your health care provider. This is important. This includes any further testing if your chest pain does not go away.  If heartburn is the cause for your chest pain, you may be told to keep your head raised (elevated) while sleeping. This reduces the chance that acid will go from your stomach into your esophagus.  Make lifestyle changes as directed by your health care provider. These may include:  Getting regular exercise. Ask your health care provider to suggest some activities that are safe for you.  Eating a heart-healthy diet. A registered dietitian can help you to learn healthy eating options.  Maintaining a healthy weight.  Managing diabetes, if necessary.  Reducing stress. SEEK MEDICAL CARE IF:  Your chest pain does not go away after treatment.  You have a rash with blisters on your chest.  You have a fever. SEEK IMMEDIATE MEDICAL CARE IF:   Your chest pain is worse.  You have an increasing cough, or you cough up blood.  You have severe abdominal pain.  You have severe weakness.  You faint.  You have chills.  You have sudden, unexplained chest discomfort.  You have sudden, unexplained discomfort in your arms, back, neck, or jaw.  You have shortness of breath at any time.  You suddenly start to sweat, or your skin gets clammy.  You feel nauseous or you vomit.  You suddenly feel light-headed or dizzy.  Your heart begins to beat quickly, or it feels like it is skipping beats. These symptoms may represent a serious  problem that is an emergency. Do not wait to see if the symptoms will go away. Get medical help right away. Call your local emergency services (911 in the [place] Do not drive yourself to the hospital.   This information is not intended to replace advice given to you by your health care provider. Make sure you discuss any questions you have with your health care provider.   Document Released: 10/29/2004 Document Revised: 02/09/2014 Document Reviewed: 08/25/2013 Elsevier Interactive Patient Education [employer]

## 2015-04-26 ENCOUNTER — Other Ambulatory Visit (HOSPITAL_COMMUNITY): Payer: Self-pay | Admitting: Family Medicine

## 2015-04-26 DIAGNOSIS — R935 Abnormal findings on diagnostic imaging of other abdominal regions, including retroperitoneum: Secondary | ICD-10-CM

## 2015-04-30 ENCOUNTER — Emergency Department (HOSPITAL_BASED_OUTPATIENT_CLINIC_OR_DEPARTMENT_OTHER)
Admission: EM | Admit: 2015-04-30 | Discharge: 2015-05-01 | Disposition: A | Discharge status: Home / Self Care | Payer: BLUE CROSS/BLUE SHIELD | Attending: Emergency Medicine | Admitting: Emergency Medicine

## 2015-04-30 ENCOUNTER — Emergency Department (HOSPITAL_COMMUNITY)
Admission: EM | Admit: 2015-04-30 | Discharge: 2015-04-30 | Disposition: A | Discharge status: Home / Self Care | Payer: BLUE CROSS/BLUE SHIELD | Source: Home / Self Care

## 2015-04-30 ENCOUNTER — Emergency Department (HOSPITAL_BASED_OUTPATIENT_CLINIC_OR_DEPARTMENT_OTHER): Payer: BLUE CROSS/BLUE SHIELD

## 2015-04-30 ENCOUNTER — Encounter (HOSPITAL_BASED_OUTPATIENT_CLINIC_OR_DEPARTMENT_OTHER): Payer: Self-pay | Admitting: *Deleted

## 2015-04-30 DIAGNOSIS — Z87891 Personal history of nicotine dependence: Secondary | ICD-10-CM | POA: Diagnosis not present

## 2015-04-30 DIAGNOSIS — R05 Cough: Secondary | ICD-10-CM | POA: Insufficient documentation

## 2015-04-30 DIAGNOSIS — R059 Cough, unspecified: Secondary | ICD-10-CM

## 2015-04-30 DIAGNOSIS — Z79899 Other long term (current) drug therapy: Secondary | ICD-10-CM | POA: Insufficient documentation

## 2015-04-30 DIAGNOSIS — R0789 Other chest pain: Secondary | ICD-10-CM | POA: Insufficient documentation

## 2015-04-30 DIAGNOSIS — E669 Obesity, unspecified: Secondary | ICD-10-CM | POA: Insufficient documentation

## 2015-04-30 DIAGNOSIS — I10 Essential (primary) hypertension: Secondary | ICD-10-CM | POA: Insufficient documentation

## 2015-04-30 NOTE — ED Notes (Signed)
C/o cough w pain under bilateral ribs when coughing

## 2015-04-30 NOTE — ED Provider Notes (Signed)
CSN: [id number]     Arrival date & time 04/30/15  2026 History   First MD Initiated Contact with Patient 04/30/15 2233     Chief Complaint  Patient presents with  . Cough    HPI   45 year old male presents today with complaints of cough. Patient reports he was seen in the ED diagnosis costochondritis. He reports at that time he was placed on ibuprofen which has resolved the symptoms. This chest pain was located over his left anterior chest wall. Patient notes at that time he had a CT scan done that showed a concerning mass. He reports he followed up with primary care provider on Friday ( 5 days ago) and had a change in his allergy medication as his primary care provider thought this was the source of his cough. He reports after changing medications worsening of symptoms continued cough, he went back to his normal medication which improved symptoms. Patient reports today that he continues to have this dry irritative nonproductive cough. He reports he had 2 episodes of red streaks yesterday in his mucus, none today. Patient reports he is having chest pain along the lower rib cage when coughing. He denies any radiation of symptoms, shortness of breath, nausea, vomiting. Patient reports he has a scheduled PET CT scan this Friday for follow-up of abnormal lung findings.    Past Medical History  Diagnosis Date  . Hypertension   . Obesity   . Hyperlipemia    Past Surgical History  Procedure Laterality Date  . Tonsillectomy     No family history on file. Social History  Substance Use Topics  . Smoking status: Former Games developer  . Smokeless tobacco: Never Used  . Alcohol Use: Yes     Comment: occasionally     Review of Systems  All other systems reviewed and are negative.    Allergies  Review of patient's allergies indicates no known allergies.  Home Medications   Prior to Admission medications   Medication Sig Start Date End Date Taking? Authorizing Provider  amLODipine (NORVASC) 5  MG tablet Take 1 tablet (5 mg total) by mouth once. 06/13/14   Kristen N Ward, DO  fexofenadine (ALLEGRA) 180 MG tablet Take 180 mg by mouth daily.    Historical Provider, MD  guaiFENesin-codeine 100-10 MG/5ML syrup Take 10 mLs by mouth 3 (three) times daily as needed for cough. 05/01/15   Eyvonne Mechanic, PA-C  naproxen (NAPROSYN) 500 MG tablet Take 1 tablet (500 mg total) by mouth 2 (two) times daily. 04/21/15   Glynn Octave, MD  Olmesartan-Amlodipine-HCTZ 40-5-12.5 MG TABS Take 1 tablet by mouth daily.    Historical Provider, MD   BP 116/82 mmHg  Pulse 94  Temp(Src) 98.6 F (37 C) (Oral)  Resp 20  Ht  (1.88 m)  Wt 186.428 kg  BMI 52.75 kg/m2  SpO2 99% Physical Exam  Constitutional: He is oriented to person, place, and time. He appears well-developed and well-nourished.  HENT:  Head: Normocephalic and atraumatic.  Right Ear: Hearing and tympanic membrane normal.  Left Ear: Hearing and tympanic membrane normal.  Nose: Nose normal.  Mouth/Throat: Uvula is midline and oropharynx is clear and moist. No oropharyngeal exudate, posterior oropharyngeal edema, posterior oropharyngeal erythema or tonsillar abscesses.  Eyes: Conjunctivae are normal. Pupils are equal, round, and reactive to light. Right eye exhibits no discharge. Left eye exhibits no discharge. No scleral icterus.  Neck: Normal range of motion. No JVD present. No tracheal deviation present.  Cardiovascular: Normal rate, regular  rhythm, normal heart sounds and intact distal pulses.  Exam reveals no gallop and no friction rub.   No murmur heard. Pulmonary/Chest: Effort normal and breath sounds normal. No stridor. No respiratory distress. He has no wheezes. He has no rales. He exhibits no tenderness.  Chest nontender palpation  Musculoskeletal: Normal range of motion. He exhibits no edema or tenderness.  Neurological: He is alert and oriented to person, place, and time. Coordination normal.  Skin: Skin is warm and dry. No rash  noted. No erythema. No pallor.  Psychiatric: He has a normal mood and affect. His behavior is normal. Judgment and thought content normal.  Nursing note and vitals reviewed.   ED Course  Procedures (including critical care time) Labs Review Labs Reviewed - No data to display  Imaging Review Dg Chest 2 View  04/30/2015  CLINICAL DATA:  Cough. EXAM: CHEST  2 VIEW COMPARISON:  04/21/2015 chest radiograph and chest CT. FINDINGS: Stable cardiomediastinal silhouette with normal heart size. No pneumothorax. No pleural effusion. There is a focal masslike opacity in the posterior upper left lung, which appears increased since 04/21/2015 chest radiograph, although this could be due to differences in technique. Otherwise clear lungs. IMPRESSION: Persistent focal masslike opacity in the posterior upper left lung, which correlates with the superior segment left lower lobe pulmonary nodule described on the 04/21/2015 chest CT. PET-CT was recommended for further evaluation on the 04/21/2015 chest CT report, as a primary bronchogenic malignancy cannot be excluded. Electronically Signed   By: Delbert PhenixJason A Poff M.D.   On: 04/30/2015 21:34   I have personally reviewed and evaluated these images and lab results as part of my medical decision-making.   EKG Interpretation None      MDM   Final diagnoses:  Cough    Labs:  Imaging:  Consults:  Therapeutics:  Discharge Meds:   Assessment/Plan: 45 year old male presents today with cough. Patient also has lower chest wall pain associated with cough. This is likely intercostal or diaphragmatic pain from continued cough. Patient has no signs of infectious etiology today, pain is likely leftover from previous URI. Patient will be given cough medication, encouraged continue using allergy medication. Patient is afebrile, nontoxic in no acute distress. Patient has follow-up evaluation for concerning CT finding. Patient will be instructed to use medication, follow-up  with primary care, return to ED if any new or worsening signs or symptoms present. Patient verbalized understanding and agreement to today's plan had no further questions or concerns at the time discharge       Eyvonne MechanicJeffrey Nathanel Tallman, PA-C 05/01/15 0151  Pricilla LovelessScott Goldston, MD 05/02/15 [phone]

## 2015-04-30 NOTE — ED Notes (Signed)
Cough x 2 weeks. States he feels it is a result of post nasal drainage. No fever.

## 2015-05-01 MED ORDER — GUAIFENESIN-CODEINE 100-10 MG/5ML PO SOLN: 10.0000 mL | Freq: Three times a day (TID) | ORAL | Status: DC | PRN

## 2015-05-01 NOTE — Discharge Instructions (Signed)

## 2015-05-03 ENCOUNTER — Ambulatory Visit (HOSPITAL_COMMUNITY)
Admission: RE | Admit: 2015-05-03 | Discharge: 2015-05-03 | Disposition: A | Discharge status: Home / Self Care | Payer: BLUE CROSS/BLUE SHIELD | Source: Ambulatory Visit | Attending: Family Medicine | Admitting: Family Medicine

## 2015-05-03 DIAGNOSIS — R918 Other nonspecific abnormal finding of lung field: Secondary | ICD-10-CM | POA: Insufficient documentation

## 2015-05-03 DIAGNOSIS — R599 Enlarged lymph nodes, unspecified: Secondary | ICD-10-CM | POA: Diagnosis not present

## 2015-05-03 DIAGNOSIS — R935 Abnormal findings on diagnostic imaging of other abdominal regions, including retroperitoneum: Secondary | ICD-10-CM

## 2015-05-03 LAB — GLUCOSE, CAPILLARY: GLUCOSE-CAPILLARY: 87 mg/dL (ref 65–99)

## 2015-05-03 MED ORDER — FLUDEOXYGLUCOSE F - 18 (FDG) INJECTION
16.8900 | Freq: Once | INTRAVENOUS | Status: AC | PRN
  Administered 2015-05-03: 16.89 via INTRAVENOUS

## 2015-05-14 ENCOUNTER — Telehealth: Payer: Self-pay

## 2015-05-14 NOTE — Telephone Encounter (Signed)
Appt scheduled.  Will sign off

## 2015-05-14 NOTE — Telephone Encounter (Signed)
Spoke with Cordelia PenSherry with Dr. Tedra SenegalBland's office- requesting ASAP consult for pt with lung mass, abn pet available in Epic.   Spoke with MW who is opening clinic tomorrow morning, ok to see pt at 10:00.  Sherry with Dr. Tedra SenegalBland's office will make pt aware of appt time/location.   MW's schedule not yet opened for tomorrow morning.  Reminder given to Crystal who is managing schedule to put pt on at scheduled appt time.  Forwarding to Crystal to follow up on.

## 2015-05-15 ENCOUNTER — Ambulatory Visit (INDEPENDENT_AMBULATORY_CARE_PROVIDER_SITE_OTHER): Payer: BLUE CROSS/BLUE SHIELD | Admitting: Internal Medicine

## 2015-05-15 ENCOUNTER — Encounter: Payer: Self-pay | Admitting: Internal Medicine

## 2015-05-15 VITALS — BP 128/90 | HR 100 | Ht 74.0 in | Wt >= 6400 oz

## 2015-05-15 DIAGNOSIS — Z6841 Body Mass Index (BMI) 40.0 and over, adult: Secondary | ICD-10-CM | POA: Insufficient documentation

## 2015-05-15 DIAGNOSIS — R918 Other nonspecific abnormal finding of lung field: Secondary | ICD-10-CM

## 2015-05-15 DIAGNOSIS — J852 Abscess of lung without pneumonia: Secondary | ICD-10-CM | POA: Insufficient documentation

## 2015-05-15 NOTE — Assessment & Plan Note (Addendum)
Body mass index is 53.26   No results found for: TSH   Contributing to gerd risk/ hbp/reviewed the need and the process to achieve and maintain neg calorie balance > defer f/u primary care including intermittently monitoring thyroid status

## 2015-05-15 NOTE — Assessment & Plan Note (Addendum)
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 although the hx of abupt Cp that resolved s rx is atypical and smoking hx is remote and not that impressive so first priority is to obtain tissue dx here so see what we're dealing with an on basis of ct/pet best option is CT directed bx of the mass (apex of sup segment is very hard to reach by fob   Discussed in detail all the  indications, usual  risks and alternatives  relative to the benefits with patient and wife over speaker phone who agree  to proceed with   with biopsy asap then regroup when we have a tissue dx  Total time devoted to counseling  = 35/6655m review case with pt/ discussion of options/alternatives/ personally creating in presence of pt  then going over specific  Instructions directly with the pt including how to use all of the meds but in particular covering each new medication in detail (see avs)

## 2015-05-15 NOTE — Patient Instructions (Addendum)
Please see patient coordinator before you leave today  to schedule CT directed biopsy and I  will call you with the results

## 2015-05-15 NOTE — Progress Notes (Signed)
Subjective:     Patient ID: Gerald Colon, male   DOB: 08/04/1970,    MRN: [id number]  HPI  44 yobm quit smoking 2001/2 with new L upper cp abruptly on 04/21/15 > ER with CTa neg for PE with relief of pain with anaprox then hemoptysis x 04/27/15 and w/u with Pos PET > referred to pulmonary clinic 05/15/2015 by Dr Parke SimmersBland  05/15/2015 1st [facility] office visit/ Genesis Novosad   Chief Complaint  Patient presents with  . Pulmonary Consult    Referred by Dr. Renaye RakersVeita Bland for eval of lung mass. Pt has had cough for approx 6 wks- occ prod with bloody sputum.    pain gone, nagging cough > 1 tsp bloody mucus per day/ no more anaprox and not taking cough meds  - no recent dental problems, fever or purulent sputum  No obvious day to day or daytime variability or assoc  Ongoing cp or chest tightness, subjective wheeze or overt sinus or hb symptoms. No unusual exp hx or h/o childhood pna/ asthma or knowledge of premature birth.  Sleeping ok without nocturnal  or early am exacerbation  of respiratory  c/o's or need for noct saba. Also denies any obvious fluctuation of symptoms with weather or environmental changes or other aggravating or alleviating factors except as outlined above   Current Medications, Allergies, Complete Past Medical History, Past Surgical History, Family History, and Social History were reviewed in [facility] electronic medical record.  ROS  The following are not active complaints unless bolded sore throat, dysphagia, dental problems, itching, sneezing,  nasal congestion on rx or excess/ purulent secretions, ear ache,   fever, chills, sweats, unintended wt loss, classically pleuritic or exertional cp, hemoptysis,  orthopnea pnd or leg swelling, presyncope, palpitations, abdominal pain, anorexia, nausea, vomiting, diarrhea  or change in bowel or bladder habits, change in stools or urine, dysuria,hematuria,  rash, arthralgias, visual complaints, headache, numbness, weakness or ataxia or  problems with walking or coordination,  change in mood/affect or memory.                Review of Systems     Objective:   Physical Exam    amb obese bm nad  Wt Readings from Last 3 Encounters:  05/15/15 415 lb (188.243 kg)  04/30/15 411 lb (186.428 kg)  04/21/15 412 lb (186.882 kg)    Vital signs reviewed   HEENT: nl dentition, turbinates, and oropharynx. Nl external ear canals without cough reflex   NECK :  without JVD/Nodes/TM/ nl carotid upstrokes bilaterally   LUNGS: no acc muscle use,  Nl contour chest which is clear to A and P bilaterally without cough on insp or exp maneuvers   CV:  RRR  no s3 or murmur or increase in P2, no edema   ABD:  soft and nontender with nl inspiratory excursion in the supine position. No bruits or organomegaly, bowel sounds nl  MS:  Nl gait/ ext warm without deformities, calf tenderness, cyanosis or clubbing No obvious joint restrictions   SKIN: warm and dry without lesions    NEURO:  alert, approp, nl sensorium with  no motor deficits      Assessment:

## 2015-05-22 NOTE — Patient Instructions (Signed)
NPO after mn, needs ride, am meds with sips

## 2015-05-23 ENCOUNTER — Other Ambulatory Visit: Payer: Self-pay | Admitting: Radiology

## 2015-05-23 ENCOUNTER — Other Ambulatory Visit: Payer: Self-pay | Admitting: General Surgery

## 2015-05-24 ENCOUNTER — Ambulatory Visit (HOSPITAL_COMMUNITY)
Admission: RE | Admit: 2015-05-24 | Discharge: 2015-05-24 | Disposition: A | Discharge status: Home / Self Care | Payer: BLUE CROSS/BLUE SHIELD | Source: Ambulatory Visit | Attending: Interventional Radiology | Admitting: Interventional Radiology

## 2015-05-24 ENCOUNTER — Encounter (HOSPITAL_COMMUNITY): Payer: Self-pay

## 2015-05-24 ENCOUNTER — Ambulatory Visit (HOSPITAL_COMMUNITY)
Admission: RE | Admit: 2015-05-24 | Discharge: 2015-05-24 | Disposition: A | Discharge status: Home / Self Care | Payer: BLUE CROSS/BLUE SHIELD | Source: Ambulatory Visit | Attending: Internal Medicine | Admitting: Internal Medicine

## 2015-05-24 DIAGNOSIS — R918 Other nonspecific abnormal finding of lung field: Secondary | ICD-10-CM | POA: Diagnosis present

## 2015-05-24 DIAGNOSIS — J189 Pneumonia, unspecified organism: Secondary | ICD-10-CM | POA: Diagnosis not present

## 2015-05-24 DIAGNOSIS — Z9889 Other specified postprocedural states: Secondary | ICD-10-CM | POA: Diagnosis not present

## 2015-05-24 LAB — CBC
HCT: 43.9 % (ref 39.0–52.0)
Hemoglobin: 14.1 g/dL (ref 13.0–17.0)
MCH: 27.4 pg (ref 26.0–34.0)
MCHC: 32.1 g/dL (ref 30.0–36.0)
MCV: 85.2 fL (ref 78.0–100.0)
Platelets: 271 10*3/uL (ref 150–400)
RBC: 5.15 MIL/uL (ref 4.22–5.81)
RDW: 13.6 % (ref 11.5–15.5)
WBC: 7.4 10*3/uL (ref 4.0–10.5)

## 2015-05-24 LAB — APTT: APTT: 39 s — AB (ref 24–37)

## 2015-05-24 LAB — PROTIME-INR
INR: 1.16 (ref 0.00–1.49)
PROTHROMBIN TIME: 15 s (ref 11.6–15.2)

## 2015-05-24 MED ORDER — SODIUM CHLORIDE 0.9 % IV SOLN
INTRAVENOUS | Status: AC | PRN
  Administered 2015-05-24: 10 mL/h via INTRAVENOUS

## 2015-05-24 MED ORDER — SODIUM CHLORIDE 0.9 % IV SOLN: Freq: Once | INTRAVENOUS | Status: DC

## 2015-05-24 MED ORDER — LIDOCAINE HCL 1 % IJ SOLN
INTRAMUSCULAR | Status: AC
  Filled 2015-05-24: qty 20

## 2015-05-24 MED ORDER — HYDROCODONE-ACETAMINOPHEN 5-325 MG PO TABS: 1.0000 | ORAL_TABLET | ORAL | Status: DC | PRN

## 2015-05-24 MED ORDER — FENTANYL CITRATE (PF) 100 MCG/2ML IJ SOLN
INTRAMUSCULAR | Status: AC | PRN
  Administered 2015-05-24: 25 ug via INTRAVENOUS
  Administered 2015-05-24: 50 ug via INTRAVENOUS
  Administered 2015-05-24 (×2): 25 ug via INTRAVENOUS

## 2015-05-24 MED ORDER — FENTANYL CITRATE (PF) 100 MCG/2ML IJ SOLN
INTRAMUSCULAR | Status: AC
  Filled 2015-05-24: qty 4

## 2015-05-24 MED ORDER — MIDAZOLAM HCL 2 MG/2ML IJ SOLN
INTRAMUSCULAR | Status: AC | PRN
  Administered 2015-05-24 (×3): 1 mg via INTRAVENOUS

## 2015-05-24 MED ORDER — MIDAZOLAM HCL 2 MG/2ML IJ SOLN
INTRAMUSCULAR | Status: AC
  Filled 2015-05-24: qty 4

## 2015-05-24 NOTE — Discharge Instructions (Signed)

## 2015-05-24 NOTE — Procedures (Signed)
CT core bx LLL lung lesion 18g cores to surg path No complication No blood loss. [facility].

## 2015-05-24 NOTE — H&P (Signed)
Chief Complaint: Patient was seen in consultation today for left lung mass biopsy at the request of Wert,Michael B  Referring Physician(s): Wert,Michael B  Supervising Physician: Oley Balm  History of Present Illness: Gerald Colon is a 45 y.o. male   Pt developed Left chest pain and cough weeks ago CXR and CTA 04/21/15 revealed No PE, but showed LLL nodule PET 3/31:  IMPRESSION: 1. 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 There is also left hilar hypermetabolic adenopathy. 2. No findings for abdominal/pelvic metastatic disease or osseous metastatic disease.  Now request for biopsy per Dr Sherene Sires Scheduled for procedure today   Past Medical History  Diagnosis Date  . Hypertension   . Obesity   . Hyperlipemia     Past Surgical History  Procedure Laterality Date  . Tonsillectomy      Allergies: Review of patient's allergies indicates no known allergies.  Medications: Prior to Admission medications   Medication Sig Start Date End Date Taking? Authorizing Provider  amLODipine (NORVASC) 5 MG tablet Take 1 tablet (5 mg total) by mouth once. Patient taking differently: Take 5 mg by mouth daily.  06/13/14  Yes Kristen N Ward, DO  Azilsartan-Chlorthalidone (EDARBYCLOR) 40-12.5 MG TABS Take 1 tablet by mouth daily.   Yes Historical Provider, MD  NOREL AD 4-10-325 MG TABS Take 1 tablet by mouth 4 (four) times daily. 04/26/15  Yes Historical Provider, MD     Family History  Problem Relation Age of Onset  . Heart disease Maternal Grandfather     Social History   Social History  . Marital Status: Married    Spouse Name: N/A  . Number of Children: N/A  . Years of Education: N/A   Occupational History  . Primary school teacher    Social History Main Topics  . Smoking status: Former Smoker -- 10 years    Types: Cigars, Cigarettes    Quit date: 02/03/2000  . Smokeless tobacco: Never Used    . Alcohol Use: 0.0 oz/week    0 Standard drinks or equivalent per week     Comment: occasionally   . Drug Use: No  . Sexual Activity: Not Asked   Other Topics Concern  . None   Social History Narrative     Review of Systems: A 12 point ROS discussed and pertinent positives are indicated in the HPI above.  All other systems are negative.  Review of Systems  Constitutional: Negative for fever, diaphoresis, activity change, appetite change and fatigue.  Respiratory: Positive for cough. Negative for shortness of breath.   Cardiovascular: Negative for chest pain.  Gastrointestinal: Negative for abdominal pain.  Neurological: Negative for weakness.  Psychiatric/Behavioral: Negative for behavioral problems and confusion.    Vital Signs: BP 141/97 mmHg  Pulse 87  Temp(Src) 98.1 F (36.7 C) (Oral)  Ht  (1.88 m)  Wt 411 lb (186.428 kg)  BMI 52.75 kg/m2  SpO2 98%  Physical Exam  Constitutional: He is oriented to person, place, and time.  Cardiovascular: Normal rate, regular rhythm and normal heart sounds.   Pulmonary/Chest: Effort normal and breath sounds normal.  Abdominal: Soft. Bowel sounds are normal.  Musculoskeletal: Normal range of motion.  Neurological: He is alert and oriented to person, place, and time.  Skin: Skin is warm and dry.  Psychiatric: He has a normal mood and affect. His behavior is normal. Judgment and thought content normal.  Nursing note and vitals  reviewed.   Mallampati Score:  MD Evaluation Airway: WNL Heart: WNL Abdomen: WNL Chest/ Lungs: WNL ASA  Classification: 2 Mallampati/Airway Score: One  Imaging: Dg Chest 2 View  04/30/2015  CLINICAL DATA:  Cough. EXAM: CHEST  2 VIEW COMPARISON:  04/21/2015 chest radiograph and chest CT. FINDINGS: Stable cardiomediastinal silhouette with normal heart size. No pneumothorax. No pleural effusion. There is a focal masslike opacity in the posterior upper left lung, which appears increased since  04/21/2015 chest radiograph, although this could be due to differences in technique. Otherwise clear lungs. IMPRESSION: Persistent focal masslike opacity in the posterior upper left lung, which correlates with the superior segment left lower lobe pulmonary nodule described on the 04/21/2015 chest CT. PET-CT was recommended for further evaluation on the 04/21/2015 chest CT report, as a primary bronchogenic malignancy cannot be excluded. Electronically Signed   By: Delbert PhenixJason A Poff M.D.   On: 04/30/2015 21:34   Nm Pet Image Initial (pi) Skull Base To Thigh  05/03/2015  CLINICAL DATA:  Initial treatment strategy for pulmonary mass. EXAM: NUCLEAR MEDICINE PET SKULL BASE TO THIGH TECHNIQUE: 16.89 mCi F-18 FDG was injected intravenously. Full-ring PET imaging was performed from the skull base to thigh after the radiotracer. CT data was obtained and used for attenuation correction and anatomic localization. FASTING BLOOD GLUCOSE:  Value: 87 mg/dl COMPARISON:  CT scan 40/98/119103/19/2017 FINDINGS: NECK No hypermetabolic lymph nodes in the neck. CHEST The superior segment left lower lobe pulmonary lesion measures 3.9 cm and demonstrates a small focus of probable cavitation. This lesion is hypermetabolic with SUV max of 18.0. There is also hypermetabolic left hilar adenopathy with SUV max of 7.1. No contralateral adenopathy. No other pulmonary lesions are identified. No acute pulmonary findings. ABDOMEN/PELVIS No abnormal hypermetabolic activity within the liver, pancreas, adrenal glands, or spleen. No hypermetabolic lymph nodes in the abdomen or pelvis. SKELETON Moderate but fairly symmetric marrow activity without obvious lesions on the CT scan. IMPRESSION: 1. 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 There is also left hilar hypermetabolic adenopathy. 2. No findings for abdominal/pelvic metastatic disease or osseous metastatic disease. Electronically Signed    By: Rudie MeyerP.  Gallerani M.D.   On: 05/03/2015 15:34    Labs:  CBC:  Recent Labs  06/13/14 0733 04/21/15 0708 05/24/15 0625  WBC 6.2 8.5 7.4  HGB 14.9 14.5 14.1  HCT 44.9 43.9 43.9  PLT 226 227 271    COAGS:  Recent Labs  05/24/15 0625  INR 1.16  APTT 39*    BMP:  Recent Labs  06/13/14 0733 04/21/15 0708  NA 139 139  K 3.9 3.8  CL 102 102  CO2 26 25  GLUCOSE 114* 110*  BUN 17 20  CALCIUM 9.1 9.5  CREATININE 1.39* 1.23  GFRNONAA >60 >60  GFRAA >60 >60    LIVER FUNCTION TESTS:  Recent Labs  06/13/14 0733  BILITOT 1.0  AST 15  ALT 18  ALKPHOS 48  PROT 6.9  ALBUMIN 3.5    TUMOR MARKERS: No results for input(s): AFPTM, CEA, CA199, CHROMGRNA in the last 8760 hours.  Assessment and Plan:  Chest pain ; cough CXR and CTA reveal LLL nodule +PET LLL nodule Now scheduled for biopsy of same Risks and Benefits discussed with the patient including, but not limited to bleeding, hemoptysis, respiratory failure requiring intubation, infection, pneumothorax requiring chest tube placement, stroke from air embolism or even death. All of the patient's questions were  answered, patient is agreeable to proceed. Consent signed and in chart.   Thank you for this interesting consult.  I greatly enjoyed meeting Santiago A Mi and look forward to participating in their care.  A copy of this report was sent to the requesting provider on this date.  Electronically Signed: Ralene Muskrat A 05/24/2015, 7:37 AM   I spent a total of  30 Minutes   in face to face in clinical consultation, greater than 50% of which was counseling/coordinating care for Left lung mass bx

## 2015-05-27 ENCOUNTER — Telehealth: Payer: Self-pay | Admitting: Internal Medicine

## 2015-05-27 MED ORDER — AMOXICILLIN-POT CLAVULANATE 875-125 MG PO TABS: 1.0000 | ORAL_TABLET | Freq: Two times a day (BID) | ORAL | Status: DC

## 2015-05-27 NOTE — Telephone Encounter (Signed)
bx is c/w infection so cough should get better once it's treated   rec Augmentin 875 mg take one pill twice daily  X 10 days - take at breakfast and supper with large glass of water.  It would help reduce the usual side effects (diarrhea and yeast infections) if you ate cultured yogurt at lunch.   For cough delsym 2 tsp every 12 h prn otc

## 2015-05-27 NOTE — Telephone Encounter (Signed)
Called spoke with patient and discussed bx results / recs as stated by MW below.  Pt voiced his understanding.  He does question when he is to follow up with MW.  Gerald Colon pt will call back with MW's recs on follow up.  Rx sent to verified pharmacy  Dr Sherene SiresWert please advise when you would like pt to follow up in the office, thank you.

## 2015-05-27 NOTE — Telephone Encounter (Signed)
Spoke with pt. He has been scheduled with MW on 06/11/15 at 11:45am. Nothing further was needed.

## 2015-05-27 NOTE — Telephone Encounter (Signed)
Spoke with pt, requesting biopsy results. Pt states since biopsy on Friday his cough has gotten worse, prod cough with blood tinged mucus X1 month but with more coughing since Friday.  Denies fever, chest pain.   Pt requesting something to help suppress his cough.   Pt uses [facility] on [address].    MW please advise.  Thanks.

## 2015-05-27 NOTE — Telephone Encounter (Signed)
2 weeks with cxr

## 2015-06-11 ENCOUNTER — Ambulatory Visit (INDEPENDENT_AMBULATORY_CARE_PROVIDER_SITE_OTHER): Payer: BLUE CROSS/BLUE SHIELD | Admitting: Internal Medicine

## 2015-06-11 ENCOUNTER — Ambulatory Visit (INDEPENDENT_AMBULATORY_CARE_PROVIDER_SITE_OTHER)
Admission: RE | Admit: 2015-06-11 | Discharge: 2015-06-11 | Disposition: A | Discharge status: Home / Self Care | Payer: BLUE CROSS/BLUE SHIELD | Source: Ambulatory Visit | Attending: Internal Medicine | Admitting: Internal Medicine

## 2015-06-11 ENCOUNTER — Encounter: Payer: Self-pay | Admitting: Internal Medicine

## 2015-06-11 VITALS — BP 128/78 | HR 72 | Ht 74.0 in | Wt >= 6400 oz

## 2015-06-11 DIAGNOSIS — J851 Abscess of lung with pneumonia: Secondary | ICD-10-CM | POA: Diagnosis not present

## 2015-06-11 DIAGNOSIS — R918 Other nonspecific abnormal finding of lung field: Secondary | ICD-10-CM

## 2015-06-11 DIAGNOSIS — K089 Disorder of teeth and supporting structures, unspecified: Secondary | ICD-10-CM

## 2015-06-11 NOTE — Assessment & Plan Note (Addendum)
Classic distribution for lung abscess and abrupt onset / assoc decayed molars all strongly suggestive of lung abscess. As is the response to Augmentin with no further evidence of active infection. I did warn the patient that can take up to 3 months to see complete radiographic clearing since a lung abscess implies necrotic lung tissue which will not heal quickly.  I therefore recommended a final follow-up visit in 6 weeks.  I had an extended discussion with the patient reviewing all relevant studies completed to date and  lasting 15 to 20 minutes of a 25 minute visit    Each maintenance medication was reviewed in detail including most importantly the difference between maintenance and prns and under what circumstances the prns are to be triggered using an action plan format that is not reflected in the computer generated alphabetically organized AVS.    Please see instructions for details which were reviewed in writing and the patient given a copy highlighting the part that I personally wrote and discussed at today's ov.

## 2015-06-11 NOTE — Progress Notes (Signed)
Subjective:     Patient ID: Gerald Colon, male   DOB: 05/27/1970,    MRN: [id number]    Brief patient profile:  44 yobm quit smoking 2001/2 with new L upper cp abruptly on 04/21/15 > ER with CTa neg for PE with relief of pain with anaprox then hemoptysis x 04/27/15 and w/u with Pos PET > referred to pulmonary clinic 05/15/2015 by Dr Parke SimmersBland with clinical dx of Lung Abscess   History of Present Illness  05/15/2015 1st [place] office visit/ Wert   Chief Complaint  Patient presents with  . Pulmonary Consult    Referred by Dr. Renaye RakersVeita Bland for eval of lung mass. Pt has had cough for approx 6 wks- occ prod with bloody sputum.    pain gone, nagging cough > 1 tsp bloody mucus per day/ no more anaprox and not taking cough meds  - no recent dental complaints, fever or purulent sputum rec CT bx 05/24/2015 >> c/w abscess > rx augmentin x 10 days then ov at 2 weeks with cxr    06/11/2015  f/u ov/Wert re: LUL  lung abscess  Chief Complaint  Patient presents with  . Follow-up    Pt states that cough did go away but has returned slightly today. Pt denies wheeze/SOB/CP/tightness. Pt does not have rescue inhaler.   some sense of pnds / allergy meds usually work fine No more severe cough or  coughing up blood and never had purulent or putrid sputum  In retrospect chronic problems with top molars x year but not tooth pain > dental appt scheduling in progress    No obvious day to day or daytime variability or assoc sob or  cp or chest tightness, subjective wheeze or overt sinus or hb symptoms. No unusual exp hx or h/o childhood pna/ asthma or knowledge of premature birth.  Sleeping ok without nocturnal  or early am exacerbation  of respiratory  c/o's or need for noct saba. Also denies any obvious fluctuation of symptoms with weather or environmental changes or other aggravating or alleviating factors except as outlined above   Current Medications, Allergies, Complete Past Medical History, Past  Surgical History, Family History, and Social History were reviewed in [facility] electronic medical record.  ROS  The following are not active complaints unless bolded sore throat, dysphagia, dental problems, itching, sneezing,  nasal congestion on rx prns otc controlling  or excess/ purulent secretions, ear ache,   fever, chills, sweats, unintended wt loss, classically pleuritic or exertional cp, hemoptysis,  orthopnea pnd or leg swelling, presyncope, palpitations, abdominal pain, anorexia, nausea, vomiting, diarrhea  or change in bowel or bladder habits, change in stools or urine, dysuria,hematuria,  rash, arthralgias, visual complaints, headache, numbness, weakness or ataxia or problems with walking or coordination,  change in mood/affect or memory.                    Objective:   Physical Exam    amb obese bm nad   06/11/2015         418   05/15/15 415 lb (188.243 kg)  04/30/15 411 lb (186.428 kg)  04/21/15 412 lb (186.882 kg)    Vital signs reviewed   HEENT: nl   turbinates, and oropharynx. Nl external ear canals without cough reflex - several decayed molars top R    NECK :  without JVD/Nodes/TM/ nl carotid upstrokes bilaterally   LUNGS: no acc muscle use,  Nl contour chest which is clear to A  and P bilaterally without cough on insp or exp maneuvers   CV:  RRR  no s3 or murmur or increase in P2, no edema   ABD:  soft and nontender with nl inspiratory excursion in the supine position. No bruits or organomegaly, bowel sounds nl  MS:  Nl gait/ ext warm without deformities, calf tenderness, cyanosis or clubbing No obvious joint restrictions   SKIN: warm and dry without lesions    NEURO:  alert, approp, nl sensorium with  no motor deficits    CXR PA and Lateral:   06/11/2015 :    I personally reviewed images and agree with radiology impression as follows:   Significant improvement in the left upper lobe consolidation since the prior study. No new abnormalities.     Assessment:

## 2015-06-11 NOTE — Patient Instructions (Signed)
Please remember to go to the   x-ray department downstairs for your tests - we will call you with the results when they are available.  Your dental care is the priority at this point  We will arrange a follow up schedule depending on your cxr results

## 2015-06-12 ENCOUNTER — Encounter: Payer: Self-pay | Admitting: Internal Medicine

## 2015-06-12 DIAGNOSIS — K089 Disorder of teeth and supporting structures, unspecified: Secondary | ICD-10-CM | POA: Insufficient documentation

## 2015-06-12 NOTE — Assessment & Plan Note (Signed)
Severe decay R upper molars associated with classic lung abscess.> referred to Dental care as a priority

## 2015-06-12 NOTE — Progress Notes (Signed)
Quick Note:  Spoke with pt and notified of results per Dr. Wert. Pt verbalized understanding and denied any questions.  ______ 

## 2015-07-26 ENCOUNTER — Ambulatory Visit (INDEPENDENT_AMBULATORY_CARE_PROVIDER_SITE_OTHER)
Admission: RE | Admit: 2015-07-26 | Discharge: 2015-07-26 | Disposition: A | Discharge status: Home / Self Care | Payer: BLUE CROSS/BLUE SHIELD | Source: Ambulatory Visit | Attending: Internal Medicine | Admitting: Internal Medicine

## 2015-07-26 ENCOUNTER — Ambulatory Visit (INDEPENDENT_AMBULATORY_CARE_PROVIDER_SITE_OTHER): Payer: BLUE CROSS/BLUE SHIELD | Admitting: Internal Medicine

## 2015-07-26 ENCOUNTER — Encounter: Payer: Self-pay | Admitting: Internal Medicine

## 2015-07-26 VITALS — BP 126/74 | HR 74 | Ht 74.0 in | Wt >= 6400 oz

## 2015-07-26 DIAGNOSIS — J852 Abscess of lung without pneumonia: Secondary | ICD-10-CM

## 2015-07-26 NOTE — Patient Instructions (Signed)
If you are satisfied with your treatment plan,  let your doctor know and he/she can either refill your medications or you can return here when your prescription runs out.     If in any way you are not 100% satisfied,  please tell us.  If 100% better, tell your friends!  Pulmonary follow up is as needed   

## 2015-07-26 NOTE — Progress Notes (Signed)
Subjective:     Patient ID: Gerald Colon, male   DOB: 05/16/1970,    MRN: [id number]    Brief patient profile:  44 yobm quit smoking 2001/2 with new L upper cp abruptly on 04/21/15 > ER with CTa neg for PE with relief of pain with anaprox then hemoptysis x 04/27/15 and w/u with Pos PET > referred to pulmonary clinic 05/15/2015 by Dr Parke SimmersBland with clinical dx of Lung Abscess   History of Present Illness  05/15/2015 1st [place] [facility] office visit/ Gerald Colon   Chief Complaint  Patient presents with  . Pulmonary Consult    Referred by Dr. Renaye RakersVeita Bland for eval of lung mass. Pt has had cough for approx 6 wks- occ prod with bloody sputum.    pain gone, nagging cough > 1 tsp bloody mucus per day/ no more anaprox and not taking cough meds  - no recent dental complaints, fever or purulent sputum rec CT bx 05/24/2015 >> c/w abscess > rx augmentin x 10 days then ov at 2 weeks with cxr    06/11/2015  f/u ov/Gerald Colon re: LUL  lung abscess  Chief Complaint  Patient presents with  . Follow-up    Pt states that cough did go away but has returned slightly today. Pt denies wheeze/SOB/CP/tightness. Pt does not have rescue inhaler.   some sense of pnds / allergy meds usually work fine No more severe cough or  coughing up blood and never had purulent or putrid sputum In retrospect chronic problems with top molars x year but not tooth pain > dental appt scheduling in progress  rec No further abx/ get teeth fixed   07/26/2015  f/u ov/Gerald Colon re:   Lung abscess [name]/still hasn't had dental surgery  Chief Complaint  Patient presents with  . Follow-up    Cough has resolved. No new co's today.     Not limited by breathing from desired activities  But very sedentary   No obvious day to day or daytime variability or assoc excess/ purulent sputum or mucus plugs  or  cp or chest tightness, subjective wheeze or overt sinus or hb symptoms. No unusual exp hx or h/o childhood pna/ asthma or knowledge of premature birth.  Sleeping  ok without nocturnal  or early am exacerbation  of respiratory  c/o's or need for noct saba. Also denies any obvious fluctuation of symptoms with weather or environmental changes or other aggravating or alleviating factors except as outlined above   Current Medications, Allergies, Complete Past Medical History, Past Surgical History, Family History, and Social History were reviewed in [facility] electronic medical record.  ROS  The following are not active complaints unless bolded sore throat, dysphagia, dental problems, itching, sneezing,  nasal congestion or excess/ purulent secretions, ear ache,   fever, chills, sweats, unintended wt loss, classically pleuritic or exertional cp, hemoptysis,  orthopnea pnd or leg swelling, presyncope, palpitations, abdominal pain, anorexia, nausea, vomiting, diarrhea  or change in bowel or bladder habits, change in stools or urine, dysuria,hematuria,  rash, arthralgias, visual complaints, headache, numbness, weakness or ataxia or problems with walking or coordination,  change in mood/affect or memory.                    Objective:   Physical Exam    amb obese bm nad  07/26/2015        416  06/11/2015         418   05/15/15 415 lb (188.243 kg)  04/30/15  411 lb (186.428 kg)  04/21/15 412 lb (186.882 kg)    Vital signs reviewed   HEENT: nl   turbinates, and oropharynx. Nl external ear canals without cough reflex - several decayed molars top R    NECK :  without JVD/Nodes/TM/ nl carotid upstrokes bilaterally   LUNGS: no acc muscle use,  Nl contour chest which is clear to A and P bilaterally without cough on insp or exp maneuvers   CV:  RRR  no s3 or murmur or increase in P2, no edema   ABD:  soft and nontender with nl inspiratory excursion in the supine position. No bruits or organomegaly, bowel sounds nl  MS:  Nl gait/ ext warm without deformities, calf tenderness, cyanosis or clubbing No obvious joint restrictions   SKIN: warm and dry  without lesions    NEURO:  alert, approp, nl sensorium with  no motor deficits       CXR PA and Lateral:   07/26/2015 :    I personally reviewed images and agree with radiology impression as follows:    Negative chest  My impression: there is very minimal scarring in the LUL- vastly improved from prev studies  Assessment:

## 2015-07-29 NOTE — Assessment & Plan Note (Signed)
CT bx 05/24/2015 >> c/w abscess > rx augmentin x 10 days   - cxr 07/26/2015 resolved with min residual scarring/ no f/u needed   Advised re dental care to keep from recurring.   pulmonar f/u is prn

## 2016-02-05 ENCOUNTER — Encounter (HOSPITAL_COMMUNITY): Payer: Self-pay | Admitting: Emergency Medicine

## 2016-02-05 ENCOUNTER — Ambulatory Visit (HOSPITAL_COMMUNITY)
Admission: EM | Admit: 2016-02-05 | Discharge: 2016-02-05 | Disposition: A | Discharge status: Home / Self Care | Payer: BLUE CROSS/BLUE SHIELD | Attending: Emergency Medicine | Admitting: Emergency Medicine

## 2016-02-05 DIAGNOSIS — I10 Essential (primary) hypertension: Secondary | ICD-10-CM | POA: Diagnosis not present

## 2016-02-05 MED ORDER — AZILSARTAN-CHLORTHALIDONE 40-12.5 MG PO TABS: 1.0000 | ORAL_TABLET | Freq: Every day | ORAL | 0 refills | Status: DC

## 2016-02-05 NOTE — ED Provider Notes (Signed)
CSN: [id number]     Arrival date & time 02/05/16  1017 History   None    Chief Complaint  Patient presents with  . Hypertension  . Medication Refill   (Consider location/radiation/quality/duration/timing/severity/associated sxs/prior Treatment) Patient is out of bp medicine and he is here to get refill.  He denies headache, SOB, or chest pain.   The history is provided by the patient.  Hypertension  This is a new problem. The problem occurs constantly. The problem has not changed since onset.Nothing aggravates the symptoms.  Medication Refill    Past Medical History:  Diagnosis Date  . Hyperlipemia   . Hypertension   . Obesity    Past Surgical History:  Procedure Laterality Date  . TONSILLECTOMY     Family History  Problem Relation Age of Onset  . Heart disease Maternal Grandfather    Social History  Substance Use Topics  . Smoking status: Former Smoker    Years: 10.00    Types: Cigars, Cigarettes    Quit date: 02/03/2000  . Smokeless tobacco: Never Used  . Alcohol use 0.0 oz/week     Comment: occasionally     Review of Systems  Constitutional: Negative.   HENT: Negative.   Eyes: Negative.   Respiratory: Negative.   Cardiovascular: Negative.   Gastrointestinal: Negative.  Negative for abdominal distention.  Endocrine: Negative.   Genitourinary: Negative.   Musculoskeletal: Negative.   Skin: Negative.   Allergic/Immunologic: Negative.   Neurological: Negative.   Hematological: Negative.   Psychiatric/Behavioral: Negative.     Allergies  Patient has no known allergies.  Home Medications   Prior to Admission medications   Medication Sig Start Date End Date Taking? Authorizing Provider  amLODipine (NORVASC) 5 MG tablet Take 1 tablet (5 mg total) by mouth once. Patient taking differently: Take 5 mg by mouth daily.  06/13/14   Kristen N Ward, DO  Azilsartan-Chlorthalidone (EDARBYCLOR) 40-12.5 MG TABS Take 1 tablet by mouth daily. 02/05/16   Deatra Canter,  FNP  fexofenadine (ALLEGRA) 180 MG tablet Take 180 mg by mouth daily.    Historical Provider, MD   Meds Ordered and Administered this Visit  Medications - No data to display  BP (!) 190/120 (BP Location: Left Arm)   Pulse 80   Temp 98.2 F (36.8 C) (Oral)   Resp 18   SpO2 95%  No data found.   Physical Exam  Constitutional: He is oriented to person, place, and time. He appears well-developed and well-nourished.  HENT:  Head: Normocephalic and atraumatic.  Right Ear: External ear normal.  Left Ear: External ear normal.  Mouth/Throat: Oropharynx is clear and moist.  Eyes: Conjunctivae and EOM are normal. Pupils are equal, round, and reactive to light.  Neck: Normal range of motion. Neck supple.  Cardiovascular: Normal rate, regular rhythm and normal heart sounds.   Pulmonary/Chest: Effort normal and breath sounds normal.  Abdominal: Soft. Bowel sounds are normal.  Neurological: He is alert and oriented to person, place, and time.  Nursing note and vitals reviewed.   Urgent Care Course   Clinical Course     Procedures (including critical care time)  Labs Review Labs Reviewed - No data to display  Imaging Review No results found.   Visual Acuity Review  Right Eye Distance:   Left Eye Distance:   Bilateral Distance:    Right Eye Near:   Left Eye Near:    Bilateral Near:         MDM  1. Essential hypertension    Refill bp medicine and follow up with Pcp    Deatra CanterWilliam J Shari Natt, FNP 02/05/16 1155

## 2016-02-05 NOTE — ED Triage Notes (Addendum)
Out of blood pressure medicine for 3 weeks.  Patient has an appt with pcp next week.  Patient denies chest pain, denies headache today.  Patient has amlodipine tablets, but since he was told to take both bp meds together, he has not taken the amlodipine over the three weeks that he has been out of edarbyclor.

## 2016-05-22 IMAGING — CR DG CHEST 2V
2 series · 2 of 2 positions shown · non-contrast
Comparison: None.

CLINICAL DATA: Shortness of breath starting at 5 a.m..
Hypertension.

EXAM:
CHEST  2 VIEW

[chest pa]
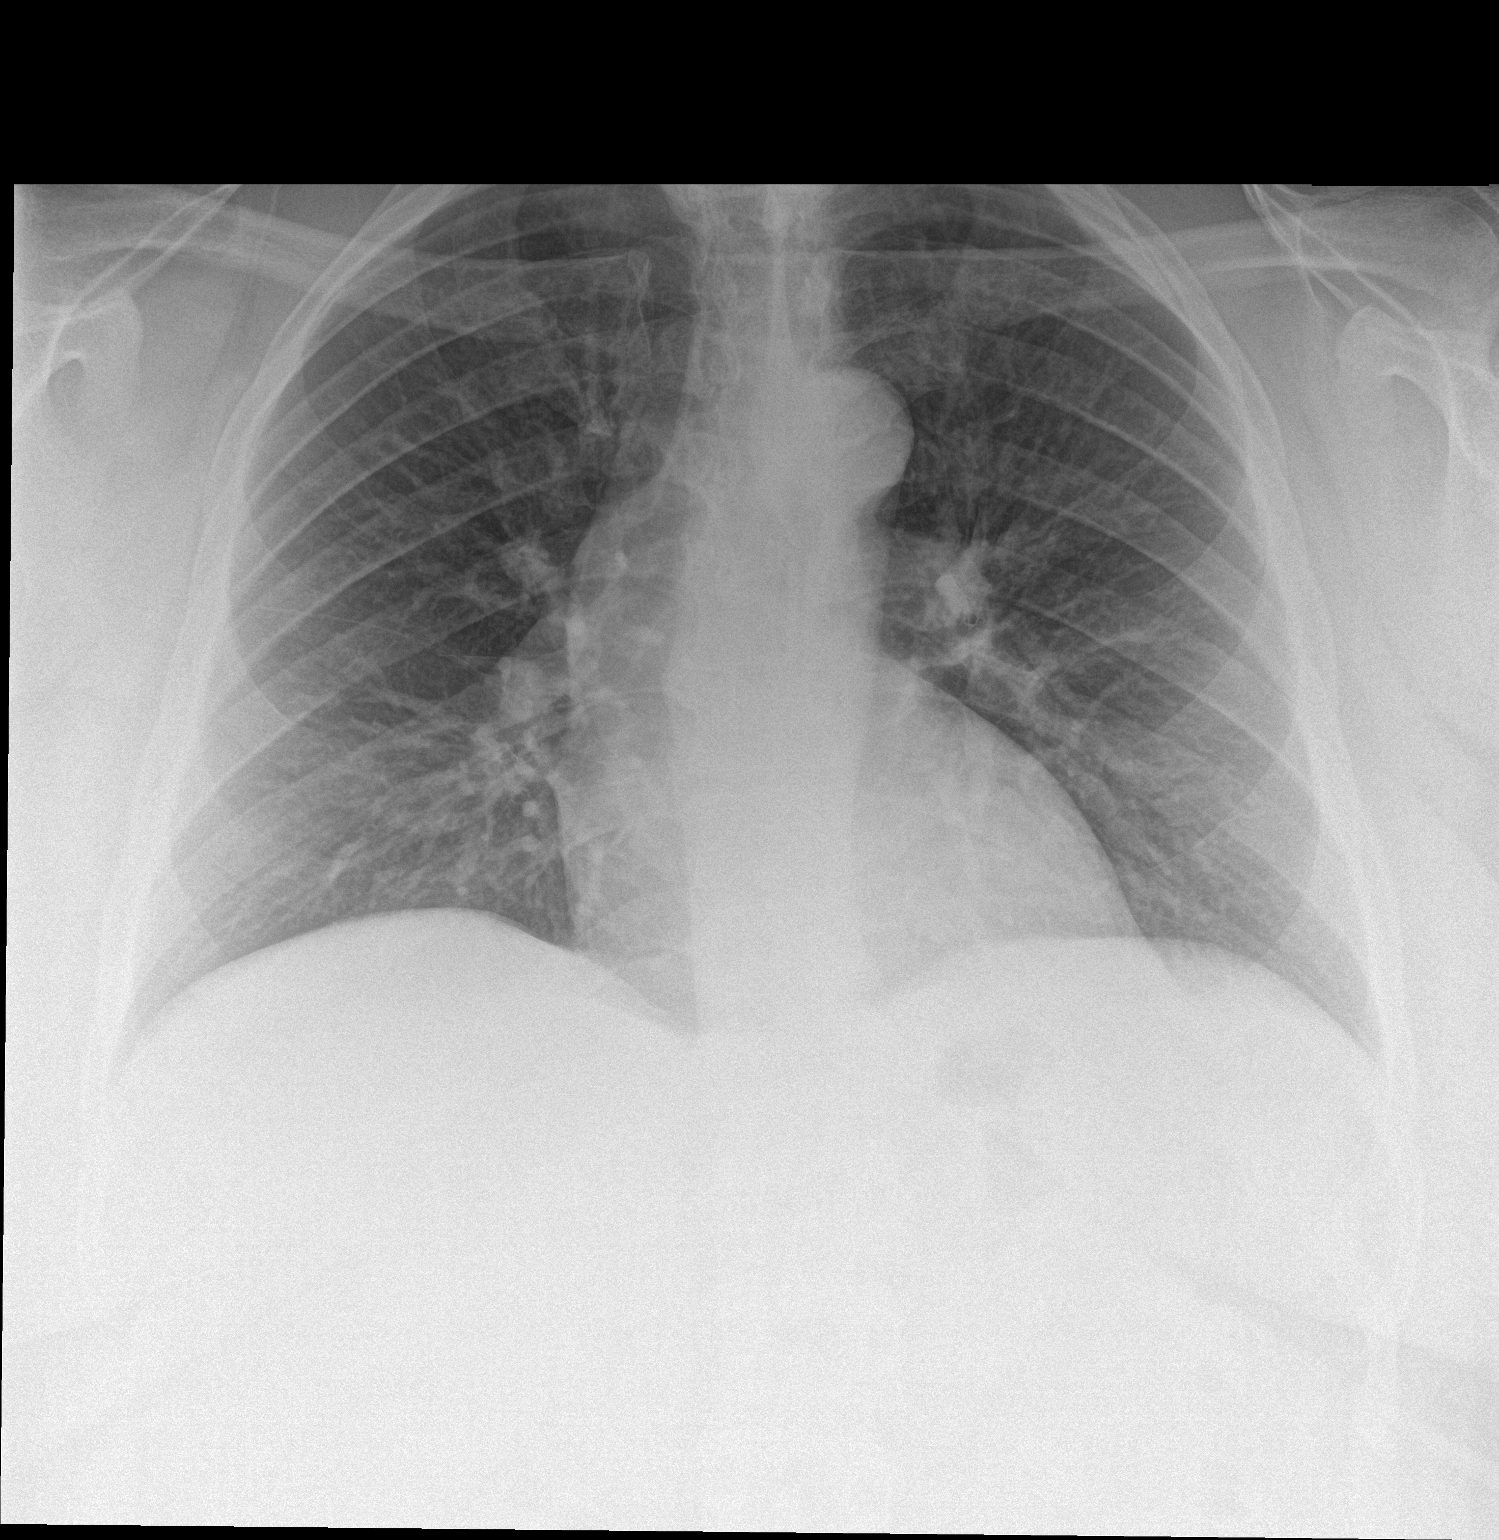

[chest lat]
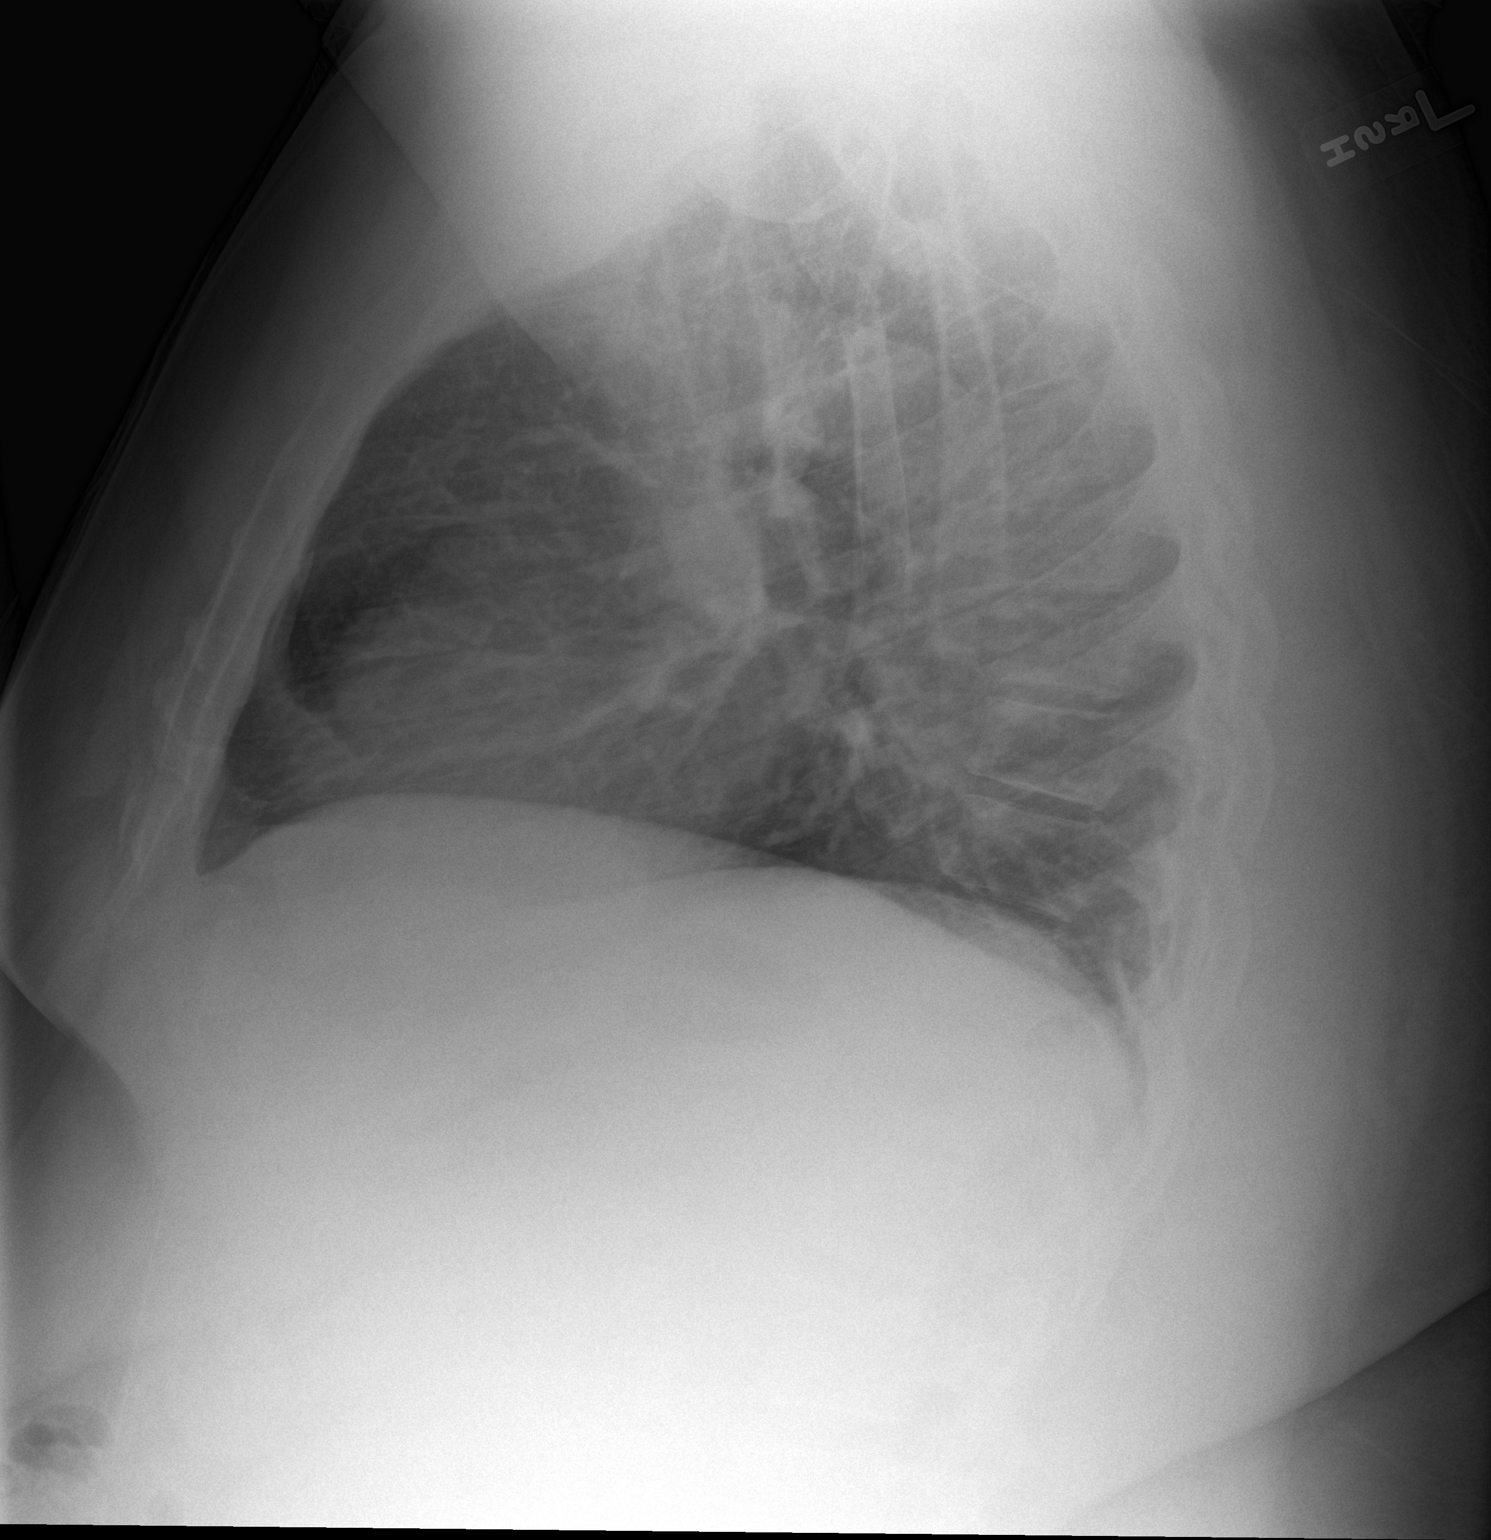

[2 of 2 positions shown; findings below may reference images not displayed]

FINDINGS: The heart size and mediastinal contours are within normal limits.
Both lungs are clear. The visualized skeletal structures are
unremarkable.
IMPRESSION: No active cardiopulmonary disease.

## 2016-12-16 ENCOUNTER — Encounter: Payer: Self-pay | Admitting: Allergy and Immunology

## 2016-12-16 ENCOUNTER — Ambulatory Visit (INDEPENDENT_AMBULATORY_CARE_PROVIDER_SITE_OTHER): Payer: BLUE CROSS/BLUE SHIELD | Admitting: Allergy and Immunology

## 2016-12-16 VITALS — BP 160/104 | HR 80 | Temp 98.1°F | Resp 18 | Ht 72.0 in | Wt >= 6400 oz

## 2016-12-16 DIAGNOSIS — I10 Essential (primary) hypertension: Secondary | ICD-10-CM

## 2016-12-16 DIAGNOSIS — J3089 Other allergic rhinitis: Secondary | ICD-10-CM | POA: Diagnosis not present

## 2016-12-16 DIAGNOSIS — Z92241 Personal history of systemic steroid therapy: Secondary | ICD-10-CM | POA: Diagnosis not present

## 2016-12-16 DIAGNOSIS — L500 Allergic urticaria: Secondary | ICD-10-CM

## 2016-12-16 MED ORDER — RANITIDINE HCL 150 MG PO TABS: 150.0000 mg | ORAL_TABLET | Freq: Two times a day (BID) | ORAL | 5 refills | Status: DC

## 2016-12-16 MED ORDER — MONTELUKAST SODIUM 10 MG PO TABS: 10.0000 mg | ORAL_TABLET | Freq: Every day | ORAL | 5 refills | Status: DC

## 2016-12-16 NOTE — Patient Instructions (Addendum)
  1.  Allergen avoidance measures  2.  Every day utilize the following medications:   A.  Cetirizine 10 mg twice a day  B.  Ranitidine 150 mg twice a day  C.  Montelukast 10 mg once a day  3.  Slowly taper off prednisone using the following plan with prednisone 10 mg tablets: 1 tablet twice a day for 1 week, then 1-1/2 tablets once a day for 1 week, then 1 tablet once a day for 1 week, then return to clinic using 1 tablet once a day  4.  Blood - CBC w/diff, CMP, TSH, T4, TP, alpha gal panel, ANA w/R, +U/A  5.  Return to clinic in 3 weeks or earlier if problem  6. Olmesarten - hydrochlorothiazide?

## 2016-12-16 NOTE — Progress Notes (Signed)
Dear Dr. Pecola Leisureeese,  [facility] you [facility] [place] [place] [facility] 12/16/2016.   Below is a summation of this patient's evaluation and recommendations.  Thank you for your referral. I will keep you informed about this patient's response to treatment.   If you have any questions please do not hesitate to contact me.   Sincerely,  Jessica PriestEric J. Danilynn Jemison, MD Allergy / Immunology [facility]   ______________________________________________________________________    NEW PATIENT NOTE  Referring Provider: Leilani Ableeese, Betti, MD Primary Provider: Leilani Ableeese, Betti, MD Date of office visit: 12/16/2016    Subjective:   Chief Complaint:  Gerald Colon (DOB: 10/27/1970) is a 46 y.o. male who presents to the clinic on 12/16/2016 with a chief complaint of Allergic Reaction .     HPI: Gerald Colon presents to this clinic in evaluation of hives.  About 2 months ago he developed this burning itchy red raised skin lesions associated with intermittent hand swelling and intense scalp itching without any other associated systemic or constitutional symptoms and without any healing with scar or hyperpigmentation and without any obvious trigger.  Occasionally he will get "fatigued" when he develops a very bad outbreak and heat may precipitate worsening of this issue.  He has not had a significant environmental change that has occurred that may be responsible for this issue nor has he started any new medications or health foods or supplements.  However, he did start a new blood pressure medicine sometime in the spring or summer.  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  He also has a history of sneezing and nasal congestion on a perennial basis for many years usually following exposure to  dust.  He will use Allegra on a daily basis and this takes care of this issue for the most part.  Therapy to date has included the use of prednisone.  He has been given several courses of prednisone and he was given instructions to use a tapering dose but in reality Francee PiccoloDerek is using 20 mg a day and has been using 20 mg a day on a consistent basis for 2 months.  Past Medical History:  Diagnosis Date  . Hyperlipemia   . Hypertension   . Obesity   . Urticaria     Past Surgical History:  Procedure Laterality Date  . TONSILLECTOMY      Allergies as of 12/16/2016   No Known Allergies     Medication List      amLODipine 5 MG tablet Commonly known as:  NORVASC Take 1 tablet (5 mg total) by mouth once.   Olmesarten - hydrochlorothiazide Take 1 tablet by mouth daily.   predniSONE 20 MG tablet Commonly known as:  DELTASONE       Review of systems negative except as noted in HPI / PMHx or noted below:  Review of Systems  Constitutional: Negative.   HENT: Negative.   Eyes: Negative.   Respiratory: Negative.   Cardiovascular: Negative.   Gastrointestinal: Negative.   Genitourinary: Negative.   Musculoskeletal: Negative.   Skin: Negative.   Neurological: Negative.   Endo/Heme/Allergies: Negative.   Psychiatric/Behavioral: Negative.     Family History  Problem Relation Age of Onset  . Heart disease Maternal Grandfather   . Other Mother  Brain tumor  . High blood pressure Mother   . Diabetes Father     Social History   Socioeconomic History  . Marital status: Married    Spouse name: Not on file  . Number of children: Not on file  . Years of education: Not on file  . Highest education level: Not on file  Social Needs  . Financial resource strain: Not on file  . Food insecurity - worry: Not on file  . Food insecurity - inability: Not on file  . Transportation needs - medical: Not on file  . Transportation needs - non-medical: Not on file  Occupational  History  . Occupation: Primary school teachergraphic design  Tobacco Use  . Smoking status: Former Smoker    Years: 10.00    Types: Cigars    Last attempt to quit: 02/03/2000    Years since quitting: 16.8  . Smokeless tobacco: Never Used  . Tobacco comment: smoked once or twice a month  Substance and Sexual Activity  . Alcohol use: No    Alcohol/week: 0.0 oz    Frequency: Never  . Drug use: No  . Sexual activity: Not on file  Other Topics Concern  . Not on file  Social History Narrative  . 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  Objective:   Vitals:   12/16/16 0917  BP: (!) 160/104  Pulse: 80  Resp: 18  Temp: 98.1 F (36.7 C)   Height: 6' (182.9 cm) Weight: (!) 437 lb 6.4 oz (198.4 kg)  Physical Exam  Constitutional: He is well-developed, well-nourished, and in no distress.  HENT:  Head: Normocephalic. Head is without right periorbital erythema and without left periorbital erythema.  Right Ear: Tympanic membrane, external ear and ear canal normal.  Left Ear: Tympanic membrane, external ear and ear canal normal.  Nose: Mucosal edema present. No rhinorrhea.  Mouth/Throat: Oropharynx is clear and moist and mucous membranes are normal. No oropharyngeal exudate.  Eyes: Conjunctivae and lids are normal. Pupils are equal, round, and reactive to light.  Neck: Trachea normal. No tracheal deviation present. No thyromegaly present.  Cardiovascular: Normal rate, regular rhythm, S1 normal, S2 normal and normal heart sounds.  No murmur heard. Pulmonary/Chest: Effort normal. No stridor. No tachypnea. No respiratory distress. He has no wheezes. He has no rales. He exhibits no tenderness.  Abdominal: Soft. He exhibits no distension and no mass. There is no  hepatosplenomegaly. There is no tenderness. There is no rebound and no guarding.  Musculoskeletal: He exhibits no edema or tenderness.  Lymphadenopathy:       Head (right side): No tonsillar adenopathy present.       Head (left side): No tonsillar adenopathy present.    He has no cervical adenopathy.    He has no axillary adenopathy.  Neurological: He is alert. Gait normal.  Skin: No rash (Blotchy erythematous blanching areas affecting arms bilaterally) noted. He is not diaphoretic. No erythema. No pallor. Nails show no clubbing.  Psychiatric: Mood and affect normal.    Diagnostics: Allergy skin tests were performed.  He did not demonstrate any hypersensitivity against a screening panel of aeroallergens or foods.  Assessment and Plan:    1. Allergic urticaria   2. Other allergic rhinitis  3. History of systemic steroid therapy   4. Essential hypertension     1.  Allergen avoidance measures  2.  Every day utilize the following medications:   A.  Cetirizine 10 mg twice a day  B.  Ranitidine 150 mg twice a day  C.  Montelukast 10 mg once a day  3.  Slowly taper off prednisone using the following plan with prednisone 10 mg tablets: 1 tablet twice a day for 1 week, then 1-1/2 tablets once a day for 1 week, then 1 tablet once a day for 1 week, then return to clinic using 1 tablet once a day  4.  Blood - CBC w/diff, CMP, TSH, T4, TP, alpha gal panel, ANA w/R, +U/A  5.  Return to clinic in 3 weeks or earlier if problem  6. Olmesarten - hydrochlorothiazide?  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  It should be noted that he did start a new blood pressure medicine earlier this year and he may have developed hypersensitivity against one of the components of this  combination antihypertensive medication and if he still continues to have problems in the face of the therapy noted above and we cannot find a specific etiologic factor contributing to his immunological hyperreactivity then we may need to have him alter his antihypertensive medications.  He has basically been on systemic steroids for 2 months and we need to slowly taper him off these medications as I suspect that his adrenal function has been somewhat diminished secondary to this daily systemic steroid exposure.  I will regroup with him in 3 weeks or earlier if there is a problem.  Jessica Priest, MD Allergy / Immunology  [facility]

## 2016-12-17 ENCOUNTER — Encounter: Payer: Self-pay | Admitting: Allergy and Immunology

## 2016-12-23 LAB — URINALYSIS
Bilirubin, UA: NEGATIVE
Glucose, UA: NEGATIVE
Ketones, UA: NEGATIVE
Leukocytes, UA: NEGATIVE
NITRITE UA: NEGATIVE
Protein, UA: NEGATIVE
RBC, UA: NEGATIVE
Specific Gravity, UA: 1.021 (ref 1.005–1.030)
UUROB: 0.2 mg/dL (ref 0.2–1.0)
pH, UA: 5.5 (ref 5.0–7.5)

## 2016-12-23 LAB — CBC WITH DIFFERENTIAL/PLATELET
BASOS: 0 %
Basophils Absolute: 0 10*3/uL (ref 0.0–0.2)
EOS (ABSOLUTE): 0 10*3/uL (ref 0.0–0.4)
Eos: 0 %
Hematocrit: 41.8 % (ref 37.5–51.0)
Hemoglobin: 14 g/dL (ref 13.0–17.7)
IMMATURE GRANS (ABS): 0.1 10*3/uL (ref 0.0–0.1)
Immature Granulocytes: 1 %
LYMPHS: 16 %
Lymphocytes Absolute: 2.2 10*3/uL (ref 0.7–3.1)
MCH: 28.4 pg (ref 26.6–33.0)
MCHC: 33.5 g/dL (ref 31.5–35.7)
MCV: 85 fL (ref 79–97)
MONOS ABS: 1 10*3/uL — AB (ref 0.1–0.9)
Monocytes: 7 %
Neutrophils Absolute: 10.4 10*3/uL — ABNORMAL HIGH (ref 1.4–7.0)
Neutrophils: 76 %
PLATELETS: 239 10*3/uL (ref 150–379)
RBC: 4.93 x10E6/uL (ref 4.14–5.80)
RDW: 15.5 % — ABNORMAL HIGH (ref 12.3–15.4)
WBC: 13.6 10*3/uL — ABNORMAL HIGH (ref 3.4–10.8)

## 2016-12-23 LAB — COMPREHENSIVE METABOLIC PANEL
A/G RATIO: 2 (ref 1.2–2.2)
ALK PHOS: 43 IU/L (ref 39–117)
ALT: 16 IU/L (ref 0–44)
AST: 12 IU/L (ref 0–40)
Albumin: 4.1 g/dL (ref 3.5–5.5)
BILIRUBIN TOTAL: 0.6 mg/dL (ref 0.0–1.2)
BUN / CREAT RATIO: 12 (ref 9–20)
BUN: 15 mg/dL (ref 6–24)
CHLORIDE: 103 mmol/L (ref 96–106)
CO2: 26 mmol/L (ref 20–29)
Calcium: 9.5 mg/dL (ref 8.7–10.2)
Creatinine, Ser: 1.22 mg/dL (ref 0.76–1.27)
GFR calc non Af Amer: 70 mL/min/{1.73_m2} (ref >59)
GFR, EST AFRICAN AMERICAN: 81 mL/min/{1.73_m2} (ref >59)
Globulin, Total: 2.1 g/dL (ref 1.5–4.5)
Glucose: 99 mg/dL (ref 65–99)
POTASSIUM: 4 mmol/L (ref 3.5–5.2)
Sodium: 141 mmol/L (ref 134–144)
TOTAL PROTEIN: 6.2 g/dL (ref 6.0–8.5)

## 2016-12-23 LAB — ALPHA-GAL PANEL
Beef (Bos spp) IgE: <0.1 kU/L (ref <0.35)
Class Interpretation: 0
LAMB CLASS INTERPRETATION: 0
PORK CLASS INTERPRETATION: 0
Pork (Sus spp) IgE: <0.1 kU/L (ref <0.35)

## 2016-12-23 LAB — THYROID PEROXIDASE ANTIBODY: THYROID PEROXIDASE ANTIBODY: 14 [IU]/mL (ref 0–34)

## 2016-12-23 LAB — TSH+FREE T4
Free T4: 0.96 ng/dL (ref 0.82–1.77)
TSH: 0.433 u[IU]/mL — ABNORMAL LOW (ref 0.450–4.500)

## 2016-12-23 LAB — ANA W/REFLEX IF POSITIVE: Anti Nuclear Antibody(ANA): NEGATIVE

## 2017-01-27 ENCOUNTER — Ambulatory Visit (INDEPENDENT_AMBULATORY_CARE_PROVIDER_SITE_OTHER): Payer: BLUE CROSS/BLUE SHIELD | Admitting: Allergy and Immunology

## 2017-01-27 ENCOUNTER — Encounter: Payer: Self-pay | Admitting: Allergy and Immunology

## 2017-01-27 VITALS — BP 150/98 | HR 72 | Resp 16

## 2017-01-27 DIAGNOSIS — L500 Allergic urticaria: Secondary | ICD-10-CM | POA: Diagnosis not present

## 2017-01-27 DIAGNOSIS — Z92241 Personal history of systemic steroid therapy: Secondary | ICD-10-CM | POA: Diagnosis not present

## 2017-01-27 DIAGNOSIS — J3089 Other allergic rhinitis: Secondary | ICD-10-CM | POA: Diagnosis not present

## 2017-01-27 NOTE — Progress Notes (Signed)
Follow-up Note  Referring Provider: Leilani Ableeese, Betti, MD Primary Provider: Leilani Ableeese, Betti, MD Date of Office Visit: 01/27/2017  Subjective:   Gerald Colon (DOB: 06/08/1970) is a 46 y.o. male who [facility] 01[facility]27/2017 in re-evaluation of the following:  HPI: Gerald Colon returns to this clinic in reevaluation of his skin problem.  I last saw him in this clinic during his initial evaluation of 16 December 2016 at which point in time we assigned a plan to decrease immunological hyperreactivity and slowly taper him off his prolonged systemic steroid use.  He continued to have problems with redness affecting his lower extremities and he was subsequently removed from his antihypertensive medication and given an alternate therapy and he is doing much better at this point.  All the issue with his lower extremity redness has resolved and he now just has some scale.  He is slowly tapering off of systemic steroids and continues on preventative therapy.  Allergies as of 01/27/2017   No Known Allergies     Medication List      aliskiren 300 MG tablet Commonly known as:  TEKTURNA Take 300 mg by mouth daily.   amLODipine 5 MG tablet Commonly known as:  NORVASC Take 1 tablet (5 mg total) by mouth once.   hydrOXYzine 50 MG capsule Commonly known as:  VISTARIL 1 CAPSULE BY ORAL ROUTE 4 TIMES PER DAY FOR 30 DAY(S)   montelukast 10 MG tablet Commonly known as:  SINGULAIR Take 1 tablet (10 mg total) at bedtime by mouth.   ranitidine 150 MG tablet Commonly known as:  ZANTAC Take 1 tablet (150 mg total) 2 (two) times daily by mouth.       Past Medical History:  Diagnosis Date  . Hyperlipemia   . Hypertension   . Obesity   . Urticaria     Past Surgical History:  Procedure Laterality Date  . TONSILLECTOMY      Review of systems negative except as noted in HPI / PMHx or noted below:  Review of Systems  Constitutional: Negative.   HENT: Negative.     Eyes: Negative.   Respiratory: Negative.   Cardiovascular: Negative.   Gastrointestinal: Negative.   Genitourinary: Negative.   Musculoskeletal: Negative.   Skin: Negative.   Neurological: Negative.   Endo/Heme/Allergies: Negative.   Psychiatric/Behavioral: Negative.      Objective:   Vitals:   01/27/17 1055  BP: (!) 150/98  Pulse: 72  Resp: 16          Physical Exam  Constitutional: He is well-developed, well-nourished, and in no distress.  HENT:  Head: Normocephalic.  Right Ear: Tympanic membrane, external ear and ear canal normal.  Left Ear: Tympanic membrane, external ear and ear canal normal.  Nose: Nose normal. No mucosal edema or rhinorrhea.  Mouth/Throat: Uvula is midline, oropharynx is clear and moist and mucous membranes are normal. No oropharyngeal exudate.  Eyes: Conjunctivae are normal.  Neck: Trachea normal. No tracheal tenderness present. No tracheal deviation present. No thyromegaly present.  Cardiovascular: Normal rate, regular rhythm, S1 normal, S2 normal and normal heart sounds.  No murmur heard. Pulmonary/Chest: Breath sounds normal. No stridor. No respiratory distress. He has no wheezes. He has no rales.  Musculoskeletal: He exhibits no edema.  Lymphadenopathy:       Head (right side): No tonsillar adenopathy present.       Head (left side): No tonsillar adenopathy present.    He has no cervical adenopathy.  Neurological: He is alert. Gait normal.  Skin: Rash (Slight scale lower extremities) noted. He is not diaphoretic. No erythema. Nails show no clubbing.  Psychiatric: Mood and affect normal.    Diagnostics: Results of blood tests obtained 16 December 2016 identified normal hepatic and renal function, WBC 13.6, absolute eosinophils 0, absolute lymphocyte 2200, hemoglobin 14.0, platelet 239, negative alpha gal panel, slightly low TSH at 0.433 iU/mL, T4 0.96 NG/DL, and thyroid peroxidase antibody at 14 IU/mL, negative ANA, and normal urine  analysis.   Assessment and Plan:   1. Allergic urticaria   2. Other allergic rhinitis   3. History of systemic steroid therapy     1.  Continue the following medications:   A.  Cetirizine 10 mg twice a day  B.  Ranitidine 150 mg twice a day  C.  Montelukast 10 mg once a day  2.  Slowly taper off prednisone using the following plan with prednisone 10 mg tablets: 10 mg alternating with 5 mg every other day for 10 days, then 5 mg one time per day for 14 days, then 5 mg every other day for 14 days, then discontinue.  3. Can apply water followed by Vaseline to lower extremities until resolved.   4. Return to clinic in 12 weeks or earlier if problem  We will assume that Gerald Colon will continue to improve now that his antihypertensive medications have been changed and the inciting trigger for his immunological hyperreactivity was 1 of his antihypertensive medications and all of his immunological hyperreactivity will abate over the next several months.  I will slowly taper him off his systemic steroids as noted above.  I did have a talk with him today about adrenal crisis and the need for stress steroids in the future should that be required given a significant infection or trauma.  When he returns to this clinic if he is doing well we will taper off his other medications.  He will contact me during the interval should there be a significant problem.  Gerald SchimkeEric Kozlow, MD Allergy / Immunology [facility]

## 2017-01-27 NOTE — Patient Instructions (Addendum)
  1.  Continue the following medications:   A.  Cetirizine 10 mg twice a day  B.  Ranitidine 150 mg twice a day  C.  Montelukast 10 mg once a day  2.  Slowly taper off prednisone using the following plan with prednisone 10 mg tablets: 10 mg alternating with 5 mg every other day for 10 days, then 5 mg one time per day for 14 days, then 5 mg every other day for 14 days, then discontinue.  3. Can apply water followed by Vaseline to lower extremities until resolved.   4. Return to clinic in 12 weeks or earlier if problem

## 2017-01-28 ENCOUNTER — Encounter: Payer: Self-pay | Admitting: Allergy and Immunology

## 2017-03-30 IMAGING — CR DG CHEST 2V
2 series · 2 of 2 positions shown · non-contrast
Comparison: June 13, 2014

ADDENDUM:
Nodular opacity seen on chest CT abutting the left major fissure is
not appreciable by radiography.
CLINICAL DATA: Left-sided chest pain for 1 day.  Cough.

EXAM:
CHEST  2 VIEW

[chest pa]
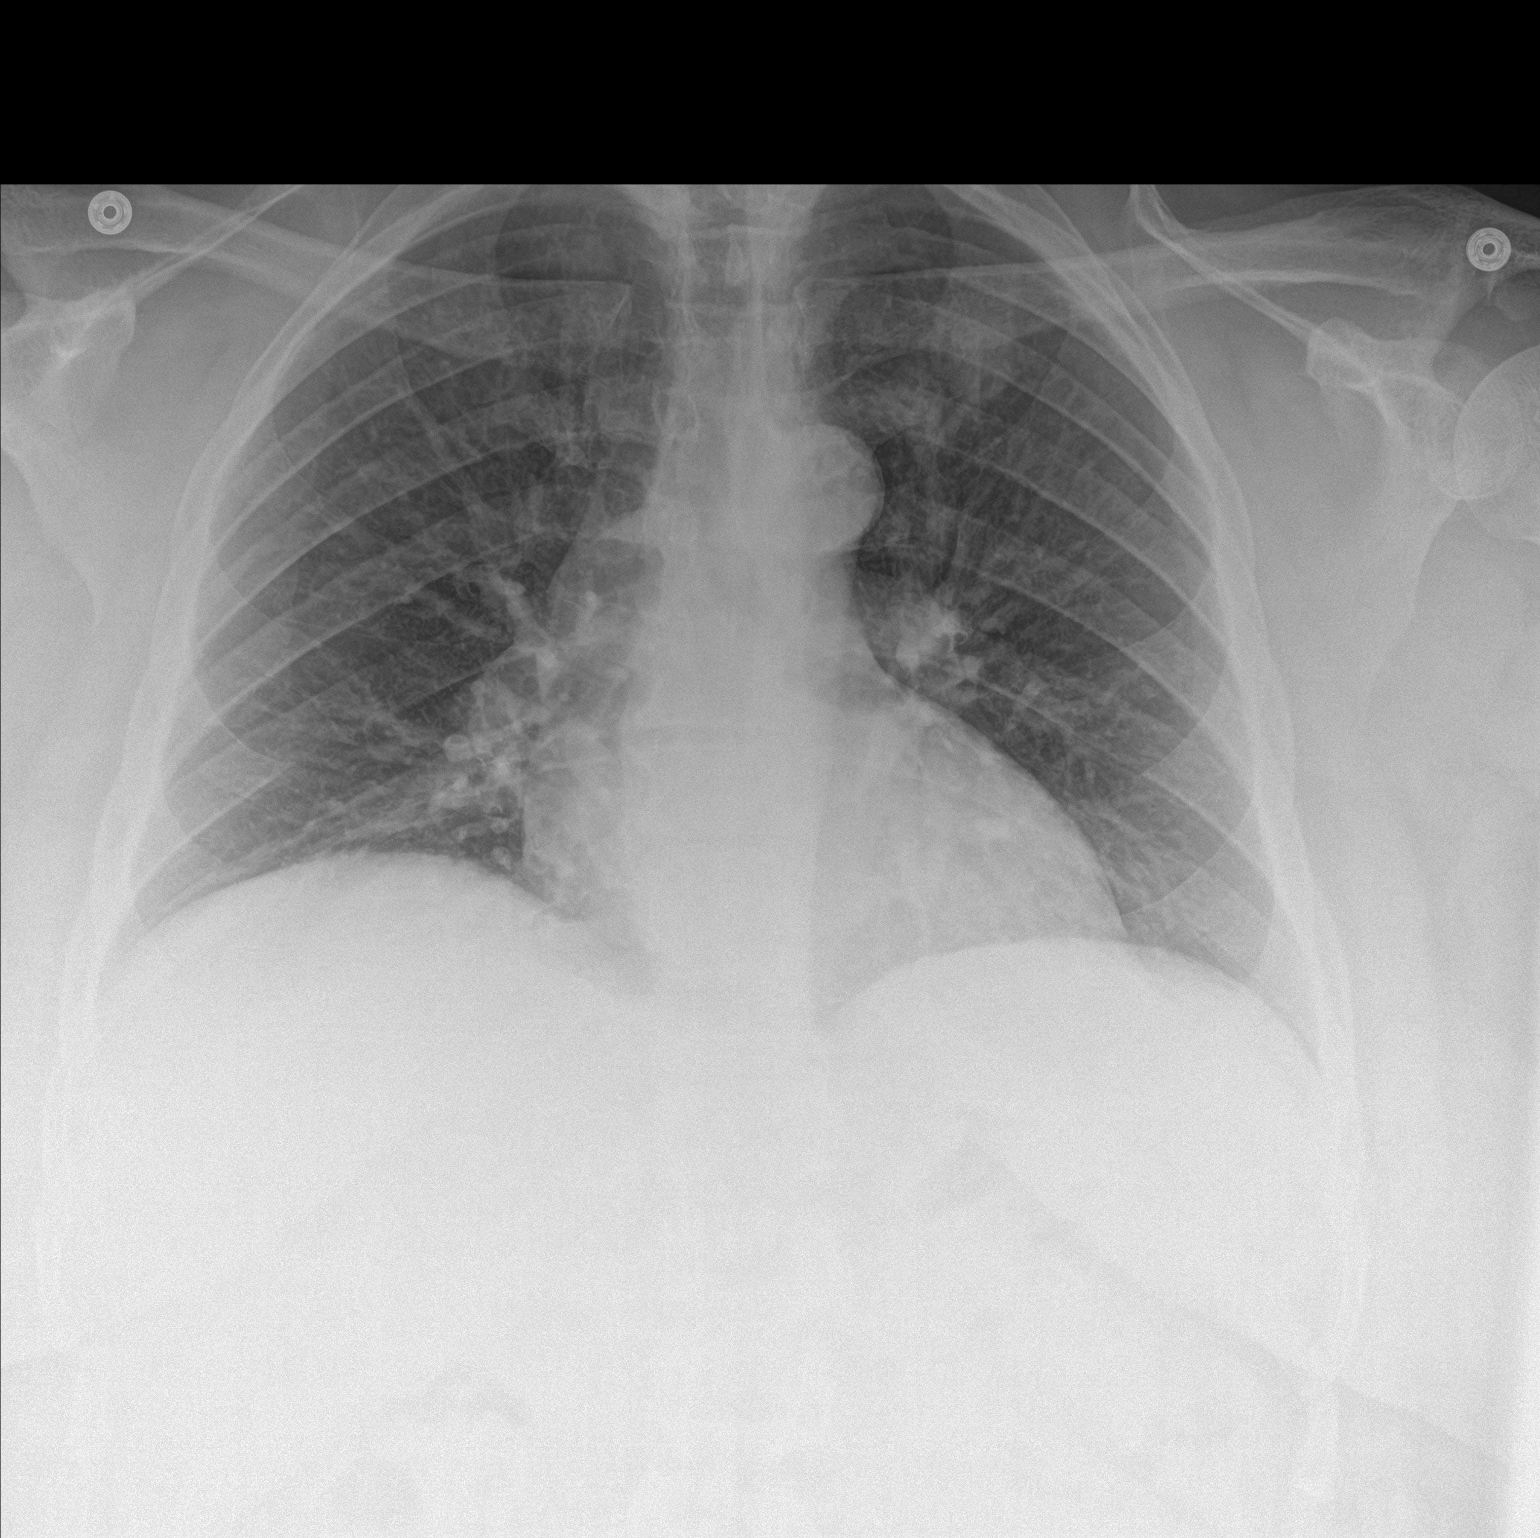

[chest lat]
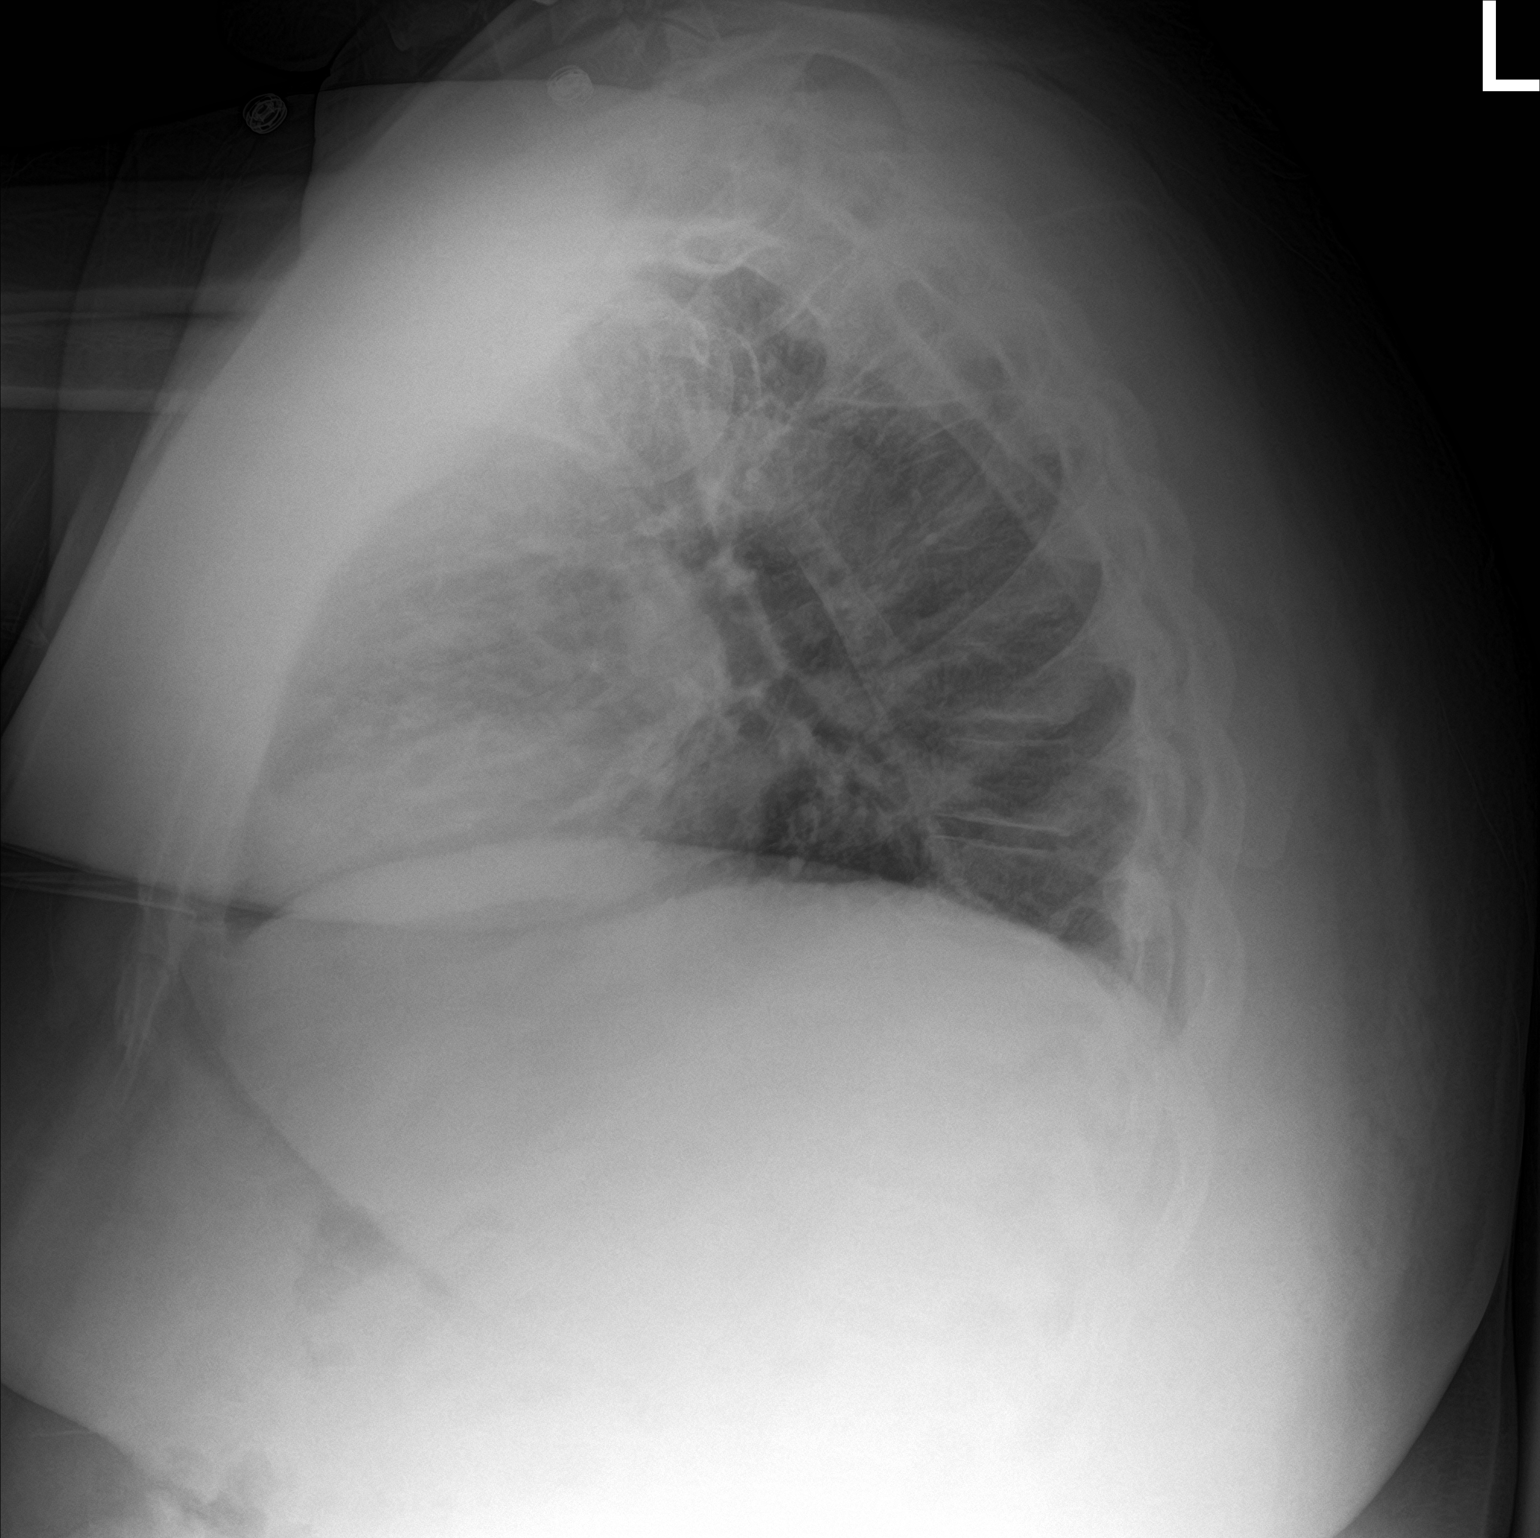

[2 of 2 positions shown; findings below may reference images not displayed]

FINDINGS: Lungs are clear. Heart size and pulmonary vascularity are normal. No
adenopathy. No pneumothorax. No bone lesions.
IMPRESSION: No edema or consolidation.

## 2017-04-08 IMAGING — CR DG CHEST 2V
2 series · 2 of 2 positions shown · non-contrast
Comparison: 04/21/2015 chest radiograph and chest CT.

CLINICAL DATA: Cough.

EXAM:
CHEST  2 VIEW

[w chest pa]
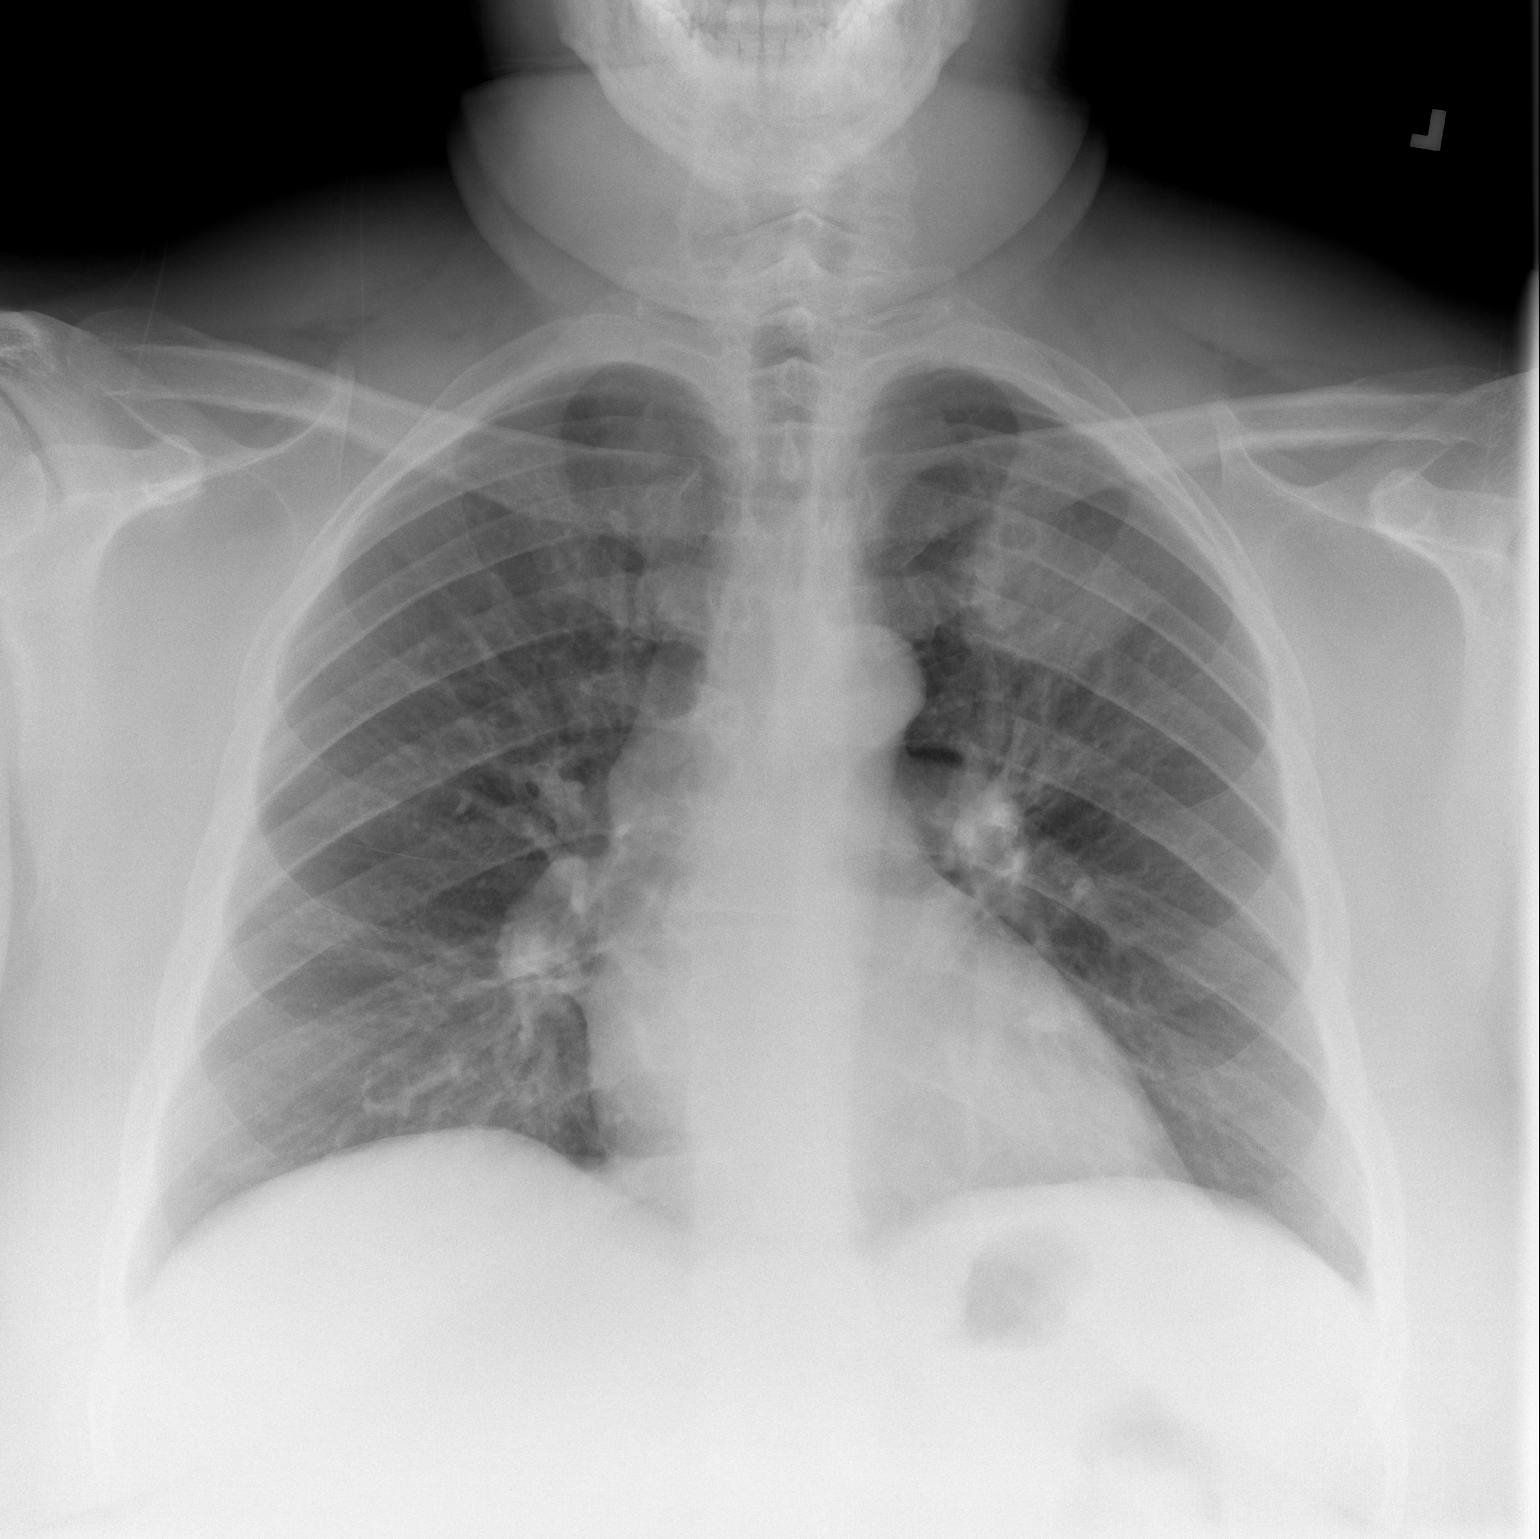

[w chest lat]
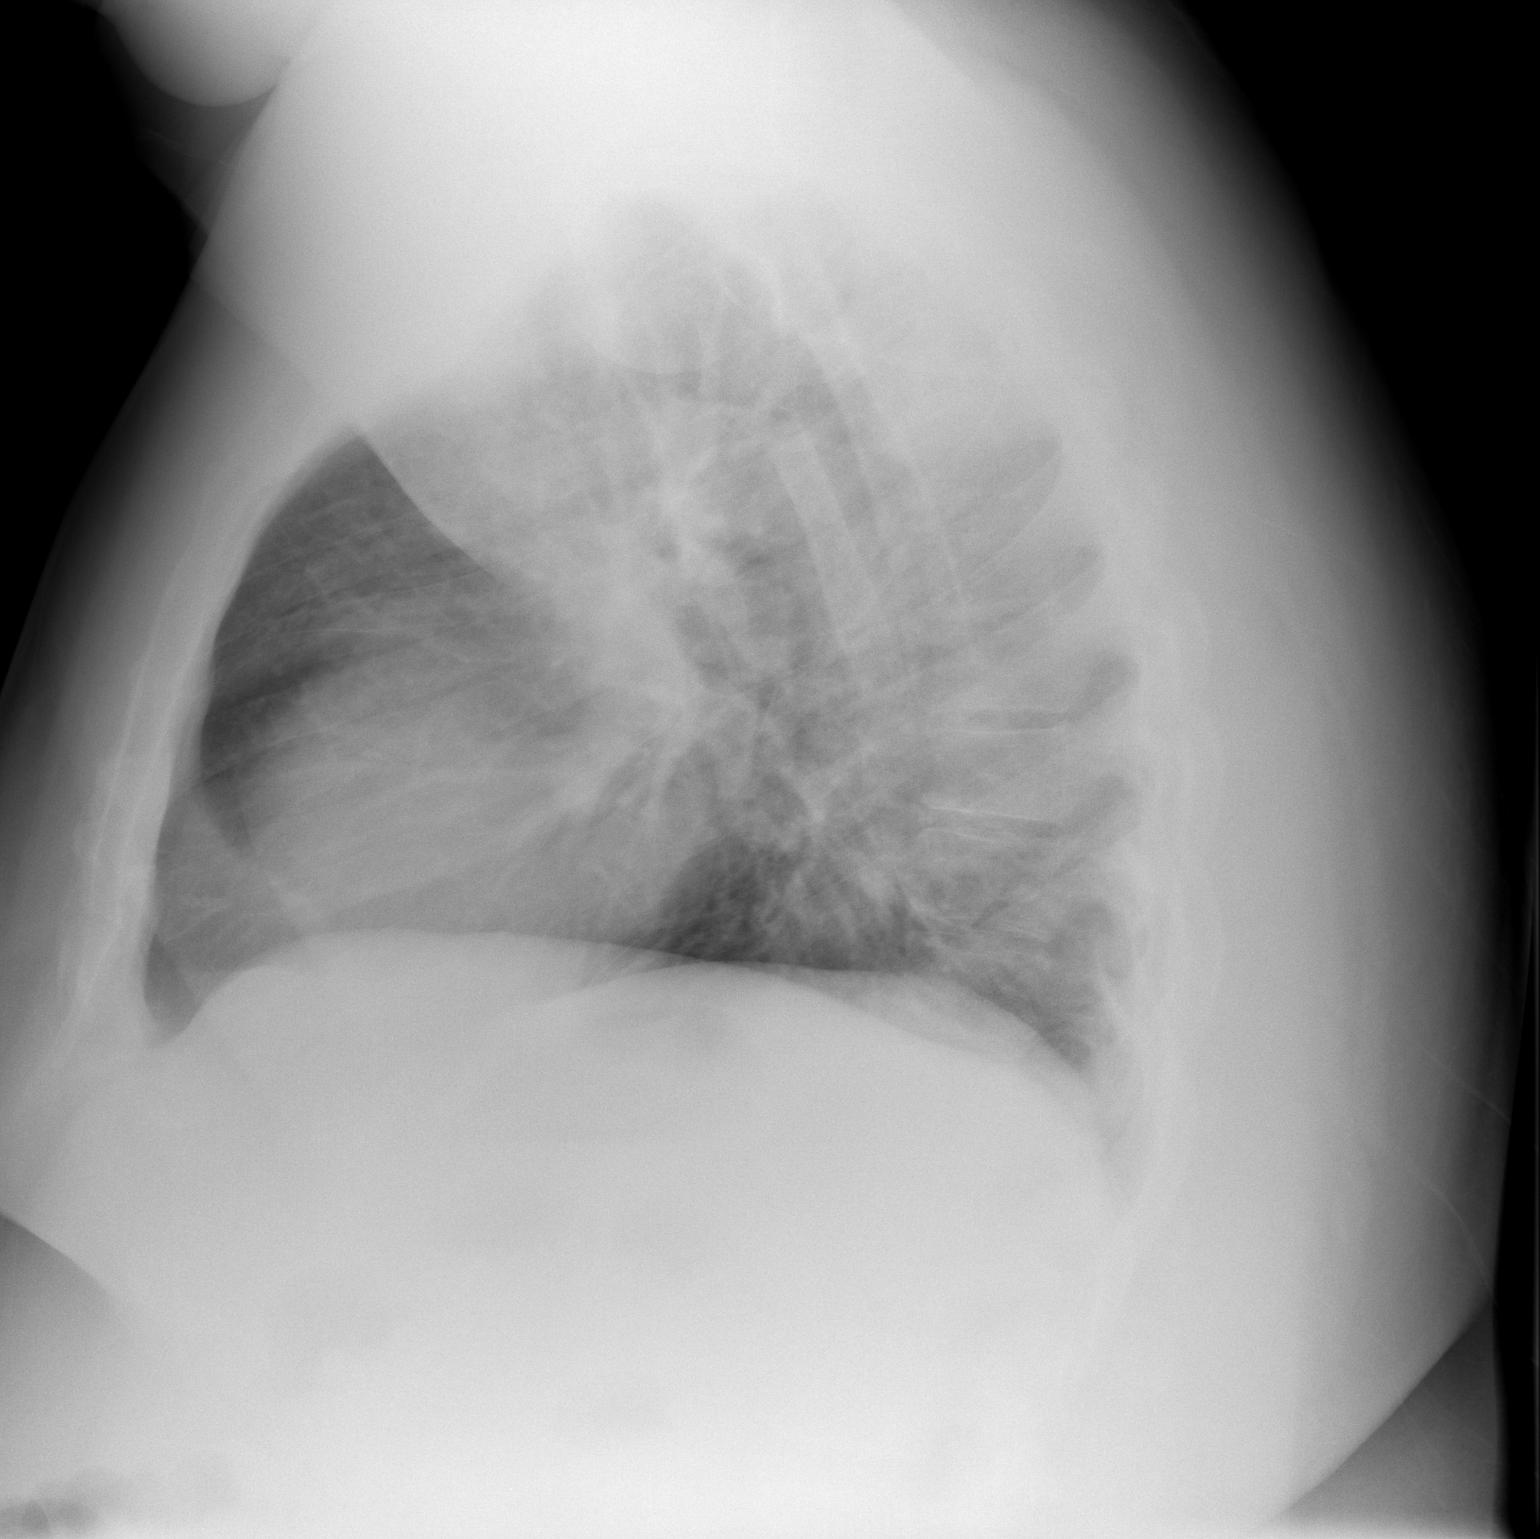

[2 of 2 positions shown; findings below may reference images not displayed]

FINDINGS: Stable cardiomediastinal silhouette with normal heart size. No
pneumothorax. No pleural effusion. There is a focal masslike opacity
in the posterior upper left lung, which appears increased since
04/21/2015 chest radiograph, although this could be due to
differences in technique. Otherwise clear lungs.
IMPRESSION: Persistent focal masslike opacity in the posterior upper left lung,
which correlates with the superior segment left lower lobe pulmonary
nodule described on the 04/21/2015 chest CT. PET-CT was recommended
for further evaluation on the 04/21/2015 chest CT report, as a
primary bronchogenic malignancy cannot be excluded.

## 2017-04-28 ENCOUNTER — Encounter: Payer: Self-pay | Admitting: Allergy and Immunology

## 2017-04-28 ENCOUNTER — Ambulatory Visit (INDEPENDENT_AMBULATORY_CARE_PROVIDER_SITE_OTHER): Payer: BLUE CROSS/BLUE SHIELD | Admitting: Allergy and Immunology

## 2017-04-28 VITALS — BP 174/98 | HR 64 | Resp 16

## 2017-04-28 DIAGNOSIS — L308 Other specified dermatitis: Secondary | ICD-10-CM

## 2017-04-28 DIAGNOSIS — L989 Disorder of the skin and subcutaneous tissue, unspecified: Secondary | ICD-10-CM

## 2017-04-28 DIAGNOSIS — L500 Allergic urticaria: Secondary | ICD-10-CM

## 2017-04-28 DIAGNOSIS — J3089 Other allergic rhinitis: Secondary | ICD-10-CM

## 2017-04-28 NOTE — Patient Instructions (Addendum)
  1.  Continue Cetirizine 10 mg twice a day  2. Discontinue Ranitidine today  3. Discontinue montelukast in 2 weeks  4. If doing well in 4 weeks, can decrease cetirizine to one time per day  5. Can apply water followed by Vaseline to lower extremities until resolved.   6. Contact clinic if problems

## 2017-04-28 NOTE — Progress Notes (Signed)
Follow-up Note  Referring Provider: Leilani Able, MD Primary Provider: Leilani Able, MD Date of Office Visit: 04/28/2017  Subjective:   Gerald Colon (DOB: Apr 24, 1970) is a 47 y.o. male who [facility] on 04/28/2017 in re-evaluation of the following:  HPI: Gerald Colon returns to this clinic in reevaluation of his urticaria and allergic rhinitis and a inflammatory process involving his lower extremities.  I last saw him in this clinic 27 January 2017 at which point in time we assigned a plan to taper off systemic steroids.  He is no longer on systemic steroids and he is doing great.  He has not had any significant skin issues with itchiness or redness while continuing on a collection of medical therapy including a H1 receptor blocker, H2 receptor blocker, and a leukotriene modifier.  The scale on his lower extremities is better but he does not really attend to this very much and is not really applying Vaseline to that area.  Allergies as of 04/28/2017   No Known Allergies     Medication List      aliskiren 300 MG tablet Commonly known as:  TEKTURNA Take 300 mg by mouth daily.   amLODipine 5 MG tablet Commonly known as:  NORVASC Take 1 tablet (5 mg total) by mouth once.   cetirizine 10 MG tablet Commonly known as:  ZYRTEC Take 10 mg by mouth 2 (two) times daily.   hydrOXYzine 50 MG capsule Commonly known as:  VISTARIL 1 CAPSULE BY ORAL ROUTE 4 TIMES PER DAY FOR 30 DAY(S)   montelukast 10 MG tablet Commonly known as:  SINGULAIR Take 1 tablet (10 mg total) at bedtime by mouth.   ranitidine 150 MG tablet Commonly known as:  ZANTAC Take 1 tablet (150 mg total) 2 (two) times daily by mouth.       Past Medical History:  Diagnosis Date  . Hyperlipemia   . Hypertension   . Obesity   . Urticaria     Past Surgical History:  Procedure Laterality Date  . TONSILLECTOMY      Review of systems negative except as noted in HPI / PMHx or noted  below:  Review of Systems  Constitutional: Negative.   HENT: Negative.   Eyes: Negative.   Respiratory: Negative.   Cardiovascular: Negative.   Gastrointestinal: Negative.   Genitourinary: Negative.   Musculoskeletal: Negative.   Skin: Negative.   Neurological: Negative.   Endo/Heme/Allergies: Negative.   Psychiatric/Behavioral: Negative.      Objective:   Vitals:   04/28/17 1019  BP: (!) 174/98  Pulse: 64  Resp: 16          Physical Exam  Skin: Rash (Slight scale without erythema affecting shins bilaterally.) noted.    Diagnostics:   Assessment and Plan:   1. Allergic urticaria   2. Other allergic rhinitis   3. Inflammatory dermatosis     1.  Continue Cetirizine 10 mg twice a day  2. Discontinue Ranitidine today  3. Discontinue montelukast in 2 weeks  4. If doing well in 4 weeks, can decrease cetirizine to one time per day  5. Can apply water followed by Vaseline to lower extremities until resolved.   6. Contact clinic if problems  Gerald Colon is doing quite well on his current plan and we will now see if we can further consolidate his medical therapy with the plan noted above.  He will continue on an antihistamine every day for his allergic rhinitis and  should he have a significant problem with any other issue as he moves forward he can contact this clinic for further evaluation and treatment.  Laurette SchimkeEric Kozlow, MD Allergy / Immunology [facility]

## 2017-04-29 ENCOUNTER — Encounter: Payer: Self-pay | Admitting: Allergy and Immunology

## 2017-09-09 ENCOUNTER — Telehealth: Payer: Self-pay

## 2017-09-09 MED ORDER — PREDNISONE 10 MG PO TABS
ORAL_TABLET | ORAL | 0 refills | Status: DC
Start: 2017-09-09 — End: 2017-10-04

## 2017-09-09 NOTE — Telephone Encounter (Signed)
Called patient and left a message for him to call

## 2017-09-09 NOTE — Telephone Encounter (Signed)
Start prednisone 10mg  tablet two times a day for 10 days, then 10mg  one time per day for 10 days

## 2017-09-09 NOTE — Telephone Encounter (Signed)
These inform patient that he should obviously discontinue the medication I gave rise to his dermatitis and we can see what happens over the course of the next several weeks.  If it gets out of control we may have to give him some steroids but it would be best not to use steroids if possible.

## 2017-09-09 NOTE — Addendum Note (Signed)
Addended by: Dub MikesHICKS, ASHLEY N on: 09/09/2017 03:11 PM   Modules accepted: Orders

## 2017-09-09 NOTE — Telephone Encounter (Signed)
Called patient and he stated that it is already out of control. He stated his eyes, hands and torso is swollen. He is taking zyrtec but he doesn't feel its help. Please advise.

## 2017-09-09 NOTE — Telephone Encounter (Signed)
Called and informed patient that we have sent in prednisone. Informed patient to Start prednisone 10 mg tablet two times a day for 10 days, then 10 mg one time per day for 10 days. He understood and appreciates it.

## 2017-09-09 NOTE — Telephone Encounter (Signed)
Patient called and stated that he accidentally took his old blood pressure medication for about two weeks and started having the same reactions with the rash and hives over his body. He is wondering if he needs to be seen or if something could be called in for this. Please advise.

## 2017-09-30 ENCOUNTER — Other Ambulatory Visit: Payer: Self-pay

## 2017-09-30 ENCOUNTER — Telehealth: Payer: Self-pay

## 2017-09-30 MED ORDER — MONTELUKAST SODIUM 10 MG PO TABS: 10.0000 mg | ORAL_TABLET | Freq: Every day | ORAL | 5 refills | Status: DC

## 2017-09-30 MED ORDER — RANITIDINE HCL 150 MG PO TABS: 150.0000 mg | ORAL_TABLET | Freq: Two times a day (BID) | ORAL | 5 refills | Status: DC

## 2017-09-30 NOTE — Telephone Encounter (Signed)
Dr. Lucie LeatherKozlow did have him on the following regimen before:             A.  Cetirizine 10 mg twice a day             B.  Ranitidine 150 mg twice a day             C.  Montelukast 10 mg once a day Does he still have these medications?  Would recommend adding the ranitidine and montekulast back in to see if can control his rash and swelling without need for prednisone since he had a recent course earlier this month.

## 2017-09-30 NOTE — Telephone Encounter (Signed)
Sent montelukast and ranitidine into pharmacy. Spoke to pt and informed him

## 2017-09-30 NOTE — Telephone Encounter (Signed)
Pt wants more prednisone, Dr. Lucie LeatherKozlow tapered him off but he states he's still having a rash and swelling from a month ago.

## 2017-09-30 NOTE — Telephone Encounter (Signed)
Pt called stating he needs more prednisone he took medication he was allergic to and it flared up is rashes and swelling and he's still having a reaction from about a month ago. Dr. Lucie LeatherKozlow tapered him off the prednisone but he's still not doing well.

## 2017-10-04 ENCOUNTER — Telehealth: Payer: Self-pay | Admitting: Allergy & Immunology

## 2017-10-04 MED ORDER — PREDNISONE 10 MG PO TABS: ORAL_TABLET | ORAL | 0 refills | Status: DC

## 2017-10-04 NOTE — Telephone Encounter (Signed)
I received a call from Mr. Gerald Colon reporting that his swelling and hive episodes have returned following his prolonged prednisone taper. He is taking ranitidine as well as montelukast and cetirizine 10mg  BID. He has not tried additional H1 blockade. He is requesting prednisone.  I recommended starting Allegra one tablet TID. I also sent in a low dose prednisone taper. Confirmed pharmacy with the patient.  Gerald Bonds, MD [facility]

## 2017-10-18 ENCOUNTER — Telehealth: Payer: Self-pay

## 2017-10-18 NOTE — Telephone Encounter (Signed)
Please have patient consistently utilize the following medications:   A. Cetirizine 10 mg twice a day B. Ranitidine 150 mg twice a day C. Montelukast 10 mg once a day  And have him seen me in clinic as his last visit was 6 months ago.

## 2017-10-18 NOTE — Telephone Encounter (Signed)
Patient would like to talk to a nurse about the swelling he is having.  Please Advise

## 2017-10-18 NOTE — Telephone Encounter (Signed)
He can add benadryl 15-50 mg every 4-6 hours if needed until his appointment tomorrow.

## 2017-10-18 NOTE — Telephone Encounter (Signed)
I called Gerald Colon back and informed him of the medications that need to be taken daily. He stated that he will switch back from Allegra to Cetirizine but he already uses the other medications on a daily basis and they do not help relieve his symptoms. He was wondering if he can take anything else to help with his symptoms since the itching in his hands swelling in his face is uncomfortable? He has an appointment scheduled for tomorrow with you.

## 2017-10-18 NOTE — Telephone Encounter (Signed)
I have called and spoken with the patient and let him know.

## 2017-10-18 NOTE — Telephone Encounter (Signed)
I called and spoke with the patient and he stated that he accidentally took his BP medication that he is allergic to a month ago and he feels that he is still having an allergic reaction to it. He is currently having swelling in his lips and around his eyes,and his hands are itching, but his breathing is not being compromised. His last telephone call was 9/2 and Dr. Dellis AnesGallagher had him take Allegra BID and sent in a Prednisone Taper. Gerald Colon state that the prednisone helped but now his symptoms are back. Will you please advise?

## 2017-10-18 NOTE — Telephone Encounter (Signed)
I called to inform the patient and his voicemail has not been set up yet. We will need to call back today to inform him of Benadryl 15-50mg  every 4-6 hours if needed until his appointment tomorrow.

## 2017-10-19 ENCOUNTER — Ambulatory Visit (INDEPENDENT_AMBULATORY_CARE_PROVIDER_SITE_OTHER): Payer: BLUE CROSS/BLUE SHIELD | Admitting: Allergy and Immunology

## 2017-10-19 ENCOUNTER — Encounter: Payer: Self-pay | Admitting: Allergy and Immunology

## 2017-10-19 VITALS — BP 144/90 | HR 76 | Resp 16 | Ht 72.0 in | Wt >= 6400 oz

## 2017-10-19 DIAGNOSIS — L500 Allergic urticaria: Secondary | ICD-10-CM

## 2017-10-19 MED ORDER — OMALIZUMAB 150 MG ~~LOC~~ SOLR
300.0000 mg | SUBCUTANEOUS | Status: DC
  Administered 2017-10-19 – 2018-06-06 (×9): 300 mg via SUBCUTANEOUS

## 2017-10-19 MED ORDER — EPINEPHRINE 0.3 MG/0.3ML IJ SOAJ: INTRAMUSCULAR | 2 refills | Status: DC

## 2017-10-19 NOTE — Progress Notes (Signed)
Follow-up Note  Referring Provider: Leilani Able, MD Primary Provider: Leilani Able, MD Date of Office Visit: 10/19/2017  Subjective:   Gerald Colon (DOB: 26-Oct-1970) is a 47 y.o. male [facility] in re-evaluation of the following:  HPI: Gerald Colon returns to this clinic in reevaluation of his recurrent angioedema and urticaria and history of allergic rhinitis.  His last visit was 28 April 2017 at which point in time he was doing much better regarding his skin condition after discontinuing his ARB.  Unfortunately, he took 2 weeks of olmesartan by mistake finishing 16 August 2017 and developed very significant problems with recurrent angioedema and urticaria that has required wto systemic steroids since that point in time.  He still continues to have very significant red raised itchy lesions that arise on his body and swelling of his lips and swelling of his palmar surface of his hands without any other associated systemic or constitutional symptoms.  He just finished a course of systemic steroids.  Allergies as of 10/19/2017   No Known Allergies     Medication List      aliskiren 300 MG tablet Commonly known as:  TEKTURNA Take 300 mg by mouth daily.   amLODipine 5 MG tablet Commonly known as:  NORVASC Take 1 tablet (5 mg total) by mouth once.   cetirizine 10 MG tablet Commonly known as:  ZYRTEC Take 10 mg by mouth 2 (two) times daily.   hydrOXYzine 50 MG capsule Commonly known as:  VISTARIL 1 CAPSULE BY ORAL ROUTE 4 TIMES PER DAY FOR 30 DAY(S)   montelukast 10 MG tablet Commonly known as:  SINGULAIR Take 1 tablet (10 mg total) by mouth at bedtime.   predniSONE 10 MG tablet Commonly known as:  DELTASONE Take one table (10mg ) twice daily for five days, then one tablet (10mg ) once daily for five days, then STOP.   ranitidine 150 MG tablet Commonly known as:  ZANTAC Take 1 tablet (150 mg total) by mouth 2 (two) times daily.       Past Medical History:  Diagnosis Date  . Hyperlipemia   . Hypertension   . Obesity   . Urticaria     Past Surgical History:  Procedure Laterality Date  . TONSILLECTOMY      Review of systems negative except as noted in HPI / PMHx or noted below:  Review of Systems  Constitutional: Negative.   HENT: Negative.   Eyes: Negative.   Respiratory: Negative.   Cardiovascular: Negative.   Gastrointestinal: Negative.   Genitourinary: Negative.   Musculoskeletal: Negative.   Skin: Negative.   Neurological: Negative.   Endo/Heme/Allergies: Negative.   Psychiatric/Behavioral: Negative.      Objective:   Vitals:   10/19/17 1105  BP: (!) 144/90  Pulse: 76  Resp: 16   Height: 6' (182.9 cm)  Weight: (!) 435 lb (197.3 kg)   Physical Exam  HENT:  Head: Normocephalic. Head is without right periorbital erythema and without left periorbital erythema.  Right Ear: Tympanic membrane, external ear and ear canal normal.  Left Ear: Tympanic membrane, external ear and ear canal normal.  Nose: Nose normal. No mucosal edema or rhinorrhea.  Mouth/Throat: Oropharynx is clear and moist and mucous membranes are normal. No oropharyngeal exudate.  Eyes: Pupils are equal, round, and reactive to light. Conjunctivae and lids are normal.  Neck: Trachea normal. No tracheal deviation present. No thyromegaly present.  Cardiovascular: Normal rate, regular rhythm, S1 normal,  S2 normal and normal heart sounds.  No murmur heard. Pulmonary/Chest: Effort normal. No stridor. No respiratory distress. He has no wheezes. He has no rales. He exhibits no tenderness.  Abdominal: Soft. He exhibits no distension and no mass. There is no hepatosplenomegaly. There is no tenderness. There is no rebound and no guarding.  Musculoskeletal: He exhibits no edema or tenderness.  Lymphadenopathy:       Head (right side): No tonsillar adenopathy present.       Head (left side): No tonsillar adenopathy present.    He  has no cervical adenopathy.    He has no axillary adenopathy.  Neurological: He is alert.  Skin: Rash (Red raised 1 cm lesion right supraorbital region, diffuse palmar erythema, slight lip swelling bilaterally.) noted. He is not diaphoretic. No erythema. No pallor. Nails show no clubbing.    Diagnostics: none  Assessment and Plan:   1. Allergic urticaria     1. Use the following every day:   A.  Cetirizine 10 mg -2 tablets twice a day (40 mg daily)  B.  Ranitidine 150 mg -1 tablet twice a day  C.  Montelukast 10 mg -1 tablet once a day  2.  Can add Benadryl 25-50 mg every 6 hours if needed  3.  Start omalizumab 300 mg today and every 4 weeks  4. Further evaluation or treatment?  5. 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  I do not think that this is a result of using a blood pressure medicine as it is been approximately 3 months since his last exposure to that medication.  He has been having this problem since late 2018 and he is now a candidate to start omalizumab and we have initiated therapy today and to be utilized every 4 weeks.  He will continue to utilize a very large collection of other medical therapy as noted above to hopefully prevent him from developing significant problems.  I will see him back in this clinic in 12 weeks or earlier if there is a problem.  Laurette SchimkeEric Annye Forrey, MD Allergy / Immunology [facility]

## 2017-10-19 NOTE — Patient Instructions (Addendum)
  1. Use the following every day:   A.  Cetirizine 10 mg -2 tablets twice a day (40 mg daily)  B.  Ranitidine 150 mg -1 tablet twice a day  C.  Montelukast 10 mg -1 tablet once a day  2.  Can add Benadryl 25-50 mg every 6 hours if needed  3.  Start omalizumab 300 mg today and every 4 weeks  4. Further evaluation or treatment?  5. Return in 12 weeks or earlier if problem

## 2017-10-19 NOTE — Progress Notes (Signed)
Immunotherapy   Patient Details  Name: Gerald Colon MRN: [id number] Date of Birth: 02/03/1970  10/19/2017  Gerald Colon started injections for  Xolair Frequency: every 4 weeks Epi-Pen:Prescription for Epi-Pen given Consent signed and patient instructions given. Patient tolerated injection well. Patient waited 30 minutes post injection with no local or systemic reactions.   Gerald Colon 10/19/2017, 12:06 PM

## 2017-10-20 ENCOUNTER — Encounter: Payer: Self-pay | Admitting: Allergy and Immunology

## 2017-11-16 ENCOUNTER — Ambulatory Visit: Payer: BLUE CROSS/BLUE SHIELD

## 2017-12-23 ENCOUNTER — Ambulatory Visit (INDEPENDENT_AMBULATORY_CARE_PROVIDER_SITE_OTHER): Payer: BLUE CROSS/BLUE SHIELD | Admitting: *Deleted

## 2017-12-23 DIAGNOSIS — L500 Allergic urticaria: Secondary | ICD-10-CM | POA: Diagnosis not present

## 2018-01-18 ENCOUNTER — Ambulatory Visit (INDEPENDENT_AMBULATORY_CARE_PROVIDER_SITE_OTHER): Payer: BLUE CROSS/BLUE SHIELD | Admitting: Allergy and Immunology

## 2018-01-18 ENCOUNTER — Encounter: Payer: Self-pay | Admitting: Allergy and Immunology

## 2018-01-18 VITALS — BP 126/90 | HR 84 | Resp 20

## 2018-01-18 DIAGNOSIS — L308 Other specified dermatitis: Secondary | ICD-10-CM | POA: Diagnosis not present

## 2018-01-18 DIAGNOSIS — J3089 Other allergic rhinitis: Secondary | ICD-10-CM

## 2018-01-18 DIAGNOSIS — L500 Allergic urticaria: Secondary | ICD-10-CM | POA: Diagnosis not present

## 2018-01-18 DIAGNOSIS — L989 Disorder of the skin and subcutaneous tissue, unspecified: Secondary | ICD-10-CM

## 2018-01-18 NOTE — Progress Notes (Signed)
Follow-up Note  Referring Provider: Leilani Able, MD Primary Provider: Leilani Able, MD Date of Office Visit: 01/18/2018  Subjective:   Gerald Colon (DOB: 06-06-1970) is a 47 y.o. male who [facility] 01[facility]2018 in re-evaluation of the following:  HPI: [name] returns to this clinic in reevaluation of his recurrent urticaria and angioedema and history of allergic rhinitis.  I have not seen him in this clinic since 19 October 2017.  He has received 2 injections of omalizumab in the treatment of his overactive immune system and he has done much better.  He still has some intermittent daily episodes of urticaria but they are much less intense and they are much less widespread across his body.  He also continues to use a collection of an H1 and H2 receptor blocker and a leukotriene modifier.  Overall he thinks there is a big improvement.  What is interesting is that he does note that on the last week during the nadir of his omalizumab injection he does have a little bit more activity of his skin.  For instance, this is his last week before his next injection and he has noticed that he is developed a few more hives in the past several days.  He does not receive the flu vaccine.  Allergies as of 01/18/2018   No Known Allergies     Medication List      aliskiren 300 MG tablet Commonly known as:  TEKTURNA Take 300 mg by mouth daily.   amLODipine 5 MG tablet Commonly known as:  NORVASC Take 1 tablet (5 mg total) by mouth once.   cetirizine 10 MG tablet Commonly known as:  ZYRTEC Take 10 mg by mouth 2 (two) times daily.   EPINEPHrine 0.3 mg/0.3 mL Soaj injection Commonly known as:  AUVI-Q Use as directed for severe allergic reaction   hydrOXYzine 50 MG capsule Commonly known as:  VISTARIL 1 CAPSULE BY ORAL ROUTE 4 TIMES PER DAY FOR 30 DAY(S)   montelukast 10 MG tablet Commonly known as:  SINGULAIR Take 1 tablet (10 mg total) by mouth at  bedtime.   ranitidine 150 MG tablet Commonly known as:  ZANTAC Take 1 tablet (150 mg total) by mouth 2 (two) times daily.       Past Medical History:  Diagnosis Date  . Hyperlipemia   . Hypertension   . Obesity   . Urticaria     Past Surgical History:  Procedure Laterality Date  . TONSILLECTOMY      Review of systems negative except as noted in HPI / PMHx or noted below:  Review of Systems  Constitutional: Negative.   HENT: Negative.   Eyes: Negative.   Respiratory: Negative.   Cardiovascular: Negative.   Gastrointestinal: Negative.   Genitourinary: Negative.   Musculoskeletal: Negative.   Skin: Negative.   Neurological: Negative.   Endo/Heme/Allergies: Negative.   Psychiatric/Behavioral: Negative.      Objective:   Vitals:   01/18/18 1041  BP: 126/90  Pulse: 84  Resp: 20          Physical Exam Constitutional:      Appearance: He is not diaphoretic.  HENT:     Head: Normocephalic.     Right Ear: Tympanic membrane, ear canal and external ear normal.     Left Ear: Tympanic membrane, ear canal and external ear normal.     Nose: Nose normal. No mucosal edema or rhinorrhea.     Mouth/Throat:  Pharynx: Uvula midline. No oropharyngeal exudate.  Eyes:     Conjunctiva/sclera: Conjunctivae normal.  Neck:     Thyroid: No thyromegaly.     Trachea: Trachea normal. No tracheal tenderness or tracheal deviation.  Cardiovascular:     Rate and Rhythm: Normal rate and regular rhythm.     Heart sounds: Normal heart sounds, S1 normal and S2 normal. No murmur.  Pulmonary:     Effort: No respiratory distress.     Breath sounds: Normal breath sounds. No stridor. No wheezing or rales.  Lymphadenopathy:     Head:     Right side of head: No tonsillar adenopathy.     Left side of head: No tonsillar adenopathy.     Cervical: No cervical adenopathy.  Skin:    Findings: No erythema or rash.     Nails: There is no clubbing.   Neurological:     Mental Status: He is  alert.     Diagnostics: none   Assessment and Plan:   1. Allergic urticaria   2. Other allergic rhinitis   3. Inflammatory dermatosis     1. Use the following every day:   A.  Cetirizine 10 mg -2 tablets twice a day (40 mg daily)  B.  Ranitidine 150 mg -1 tablet twice a day  C.  Montelukast 10 mg -1 tablet once a day  2.  Can add Benadryl 25-50 mg every 6 hours if needed  3.  Continue omalizumab 300 mg every 4 weeks  4. Return in 6 months or earlier if problem  Overall [name] is doing very well and has shown improvement with his current plan which includes omalizumab.  In the future he will either continue with his active immune system with some control with his current therapy or this process will burnout or this process will declare itself better so we can identify the trigger for his immunological hyperreactivity.  If he does well I will see him back in this clinic in 6 months or earlier if there is a problem.  Laurette SchimkeEric Kozlow, MD Allergy / Immunology [facility]

## 2018-01-18 NOTE — Patient Instructions (Addendum)
  1. Use the following every day:   A.  Cetirizine 10 mg -2 tablets twice a day (40 mg daily)  B.  Ranitidine 150 mg -1 tablet twice a day  C.  Montelukast 10 mg -1 tablet once a day  2.  Can add Benadryl 25-50 mg every 6 hours if needed  3.  Continue omalizumab 300 mg every 4 weeks  4. Return in 6 months or earlier if problem

## 2018-01-19 ENCOUNTER — Encounter: Payer: Self-pay | Admitting: Allergy and Immunology

## 2018-01-20 ENCOUNTER — Ambulatory Visit (INDEPENDENT_AMBULATORY_CARE_PROVIDER_SITE_OTHER): Payer: BLUE CROSS/BLUE SHIELD | Admitting: *Deleted

## 2018-01-20 DIAGNOSIS — L500 Allergic urticaria: Secondary | ICD-10-CM

## 2018-02-11 ENCOUNTER — Other Ambulatory Visit: Payer: Self-pay

## 2018-02-11 MED ORDER — RANITIDINE HCL 150 MG PO TABS: 150.0000 mg | ORAL_TABLET | Freq: Two times a day (BID) | ORAL | 1 refills | Status: DC

## 2018-02-17 ENCOUNTER — Ambulatory Visit (INDEPENDENT_AMBULATORY_CARE_PROVIDER_SITE_OTHER): Payer: BLUE CROSS/BLUE SHIELD | Admitting: *Deleted

## 2018-02-17 DIAGNOSIS — L500 Allergic urticaria: Secondary | ICD-10-CM

## 2018-02-18 ENCOUNTER — Telehealth: Payer: Self-pay | Admitting: *Deleted

## 2018-02-18 NOTE — Telephone Encounter (Signed)
Will have to see if I can get approved first since its not in FDA guidelines not all Ins will approve. You can let him I will attempt approval and be in contact with him if I get approved

## 2018-02-18 NOTE — Telephone Encounter (Signed)
Patient received his Xolair shot and stated he is still having problems. Patient stated at his last visit he was told if thing did not improve he could start getting his shot every 3 weeks. Is that okay? Please advise.

## 2018-03-10 ENCOUNTER — Ambulatory Visit: Payer: BLUE CROSS/BLUE SHIELD

## 2018-03-11 ENCOUNTER — Ambulatory Visit: Payer: Self-pay

## 2018-03-14 ENCOUNTER — Ambulatory Visit (INDEPENDENT_AMBULATORY_CARE_PROVIDER_SITE_OTHER): Payer: BLUE CROSS/BLUE SHIELD | Admitting: *Deleted

## 2018-03-14 DIAGNOSIS — L500 Allergic urticaria: Secondary | ICD-10-CM | POA: Diagnosis not present

## 2018-04-04 ENCOUNTER — Ambulatory Visit (INDEPENDENT_AMBULATORY_CARE_PROVIDER_SITE_OTHER): Payer: BLUE CROSS/BLUE SHIELD

## 2018-04-04 DIAGNOSIS — L500 Allergic urticaria: Secondary | ICD-10-CM | POA: Diagnosis not present

## 2018-04-13 ENCOUNTER — Telehealth: Payer: Self-pay | Admitting: Allergy and Immunology

## 2018-04-13 NOTE — Telephone Encounter (Signed)
Patient has taken prednisone in the past for a period of time. He is concerned that it might have affected his immune system with the other medical issues he has. 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

## 2018-04-13 NOTE — Telephone Encounter (Signed)
Please inform patient that the immune suppression that occurs with prednisone use usually lasts no longer than 1 month after a short course of prednisone.

## 2018-04-13 NOTE — Telephone Encounter (Signed)
Dr. Lucie Leather please advise on instructions for this patient. Thank you.

## 2018-04-14 ENCOUNTER — Encounter (INDEPENDENT_AMBULATORY_CARE_PROVIDER_SITE_OTHER): Payer: BLUE CROSS/BLUE SHIELD

## 2018-04-14 NOTE — Telephone Encounter (Signed)
Patient informed and also told to practice good hand hygiene.

## 2018-04-25 ENCOUNTER — Ambulatory Visit (INDEPENDENT_AMBULATORY_CARE_PROVIDER_SITE_OTHER): Payer: BLUE CROSS/BLUE SHIELD

## 2018-04-25 ENCOUNTER — Other Ambulatory Visit: Payer: Self-pay

## 2018-04-25 DIAGNOSIS — L500 Allergic urticaria: Secondary | ICD-10-CM

## 2018-04-26 ENCOUNTER — Ambulatory Visit (INDEPENDENT_AMBULATORY_CARE_PROVIDER_SITE_OTHER): Payer: BLUE CROSS/BLUE SHIELD | Admitting: Bariatrics

## 2018-05-09 ENCOUNTER — Telehealth: Payer: Self-pay

## 2018-05-09 MED ORDER — MONTELUKAST SODIUM 10 MG PO TABS: 10.0000 mg | ORAL_TABLET | Freq: Every day | ORAL | 2 refills | Status: DC

## 2018-05-09 NOTE — Telephone Encounter (Signed)
Pharmacy faxed refill request for Montelukast.  Refill has been sent.

## 2018-05-10 ENCOUNTER — Ambulatory Visit (INDEPENDENT_AMBULATORY_CARE_PROVIDER_SITE_OTHER): Payer: BLUE CROSS/BLUE SHIELD | Admitting: Bariatrics

## 2018-05-11 ENCOUNTER — Telehealth: Payer: Self-pay | Admitting: *Deleted

## 2018-05-11 MED ORDER — FAMOTIDINE 20 MG PO TABS: 20.0000 mg | ORAL_TABLET | Freq: Two times a day (BID) | ORAL | 0 refills | Status: DC

## 2018-05-11 NOTE — Telephone Encounter (Signed)
Patient called and needs replacement for Ranitidine 150 mg.  Patient requests 90 day supply sent to [facility].

## 2018-05-11 NOTE — Telephone Encounter (Signed)
Medication sent to pharmacy  

## 2018-05-16 ENCOUNTER — Ambulatory Visit (INDEPENDENT_AMBULATORY_CARE_PROVIDER_SITE_OTHER): Payer: BLUE CROSS/BLUE SHIELD

## 2018-05-16 ENCOUNTER — Other Ambulatory Visit: Payer: Self-pay

## 2018-05-16 DIAGNOSIS — L500 Allergic urticaria: Secondary | ICD-10-CM | POA: Diagnosis not present

## 2018-05-23 ENCOUNTER — Telehealth: Payer: Self-pay | Admitting: Allergy and Immunology

## 2018-05-23 NOTE — Telephone Encounter (Signed)
Requesting refill for Montelukast. Said he called pharmacy and they told him to call us. [facility].

## 2018-05-23 NOTE — Telephone Encounter (Signed)
Spoke patient and refills were sent on 05/09/2018, patient went to pharmacy and they told him he needed to contact us regarding refills. After speaking with pharmacy they stated that is medication is on back order due to manufacture.

## 2018-05-25 ENCOUNTER — Telehealth: Payer: Self-pay

## 2018-05-25 ENCOUNTER — Other Ambulatory Visit: Payer: Self-pay

## 2018-05-25 NOTE — Telephone Encounter (Signed)
Error

## 2018-05-26 ENCOUNTER — Other Ambulatory Visit: Payer: Self-pay

## 2018-05-26 MED ORDER — FAMOTIDINE 20 MG PO TABS: 20.0000 mg | ORAL_TABLET | Freq: Two times a day (BID) | ORAL | 0 refills | Status: DC

## 2018-06-06 ENCOUNTER — Other Ambulatory Visit: Payer: Self-pay

## 2018-06-06 ENCOUNTER — Ambulatory Visit (INDEPENDENT_AMBULATORY_CARE_PROVIDER_SITE_OTHER): Payer: BLUE CROSS/BLUE SHIELD

## 2018-06-06 DIAGNOSIS — L500 Allergic urticaria: Secondary | ICD-10-CM | POA: Diagnosis not present

## 2018-06-28 ENCOUNTER — Ambulatory Visit (INDEPENDENT_AMBULATORY_CARE_PROVIDER_SITE_OTHER): Payer: BLUE CROSS/BLUE SHIELD | Admitting: *Deleted

## 2018-06-28 ENCOUNTER — Other Ambulatory Visit: Payer: Self-pay

## 2018-06-28 DIAGNOSIS — L500 Allergic urticaria: Secondary | ICD-10-CM

## 2018-06-28 MED ORDER — OMALIZUMAB 150 MG ~~LOC~~ SOLR
150.0000 mg | SUBCUTANEOUS | Status: DC
  Administered 2018-06-28: 150 mg via SUBCUTANEOUS

## 2018-07-19 ENCOUNTER — Ambulatory Visit (INDEPENDENT_AMBULATORY_CARE_PROVIDER_SITE_OTHER): Payer: BC Managed Care – PPO | Admitting: *Deleted

## 2018-07-19 ENCOUNTER — Other Ambulatory Visit: Payer: Self-pay

## 2018-07-19 DIAGNOSIS — L500 Allergic urticaria: Secondary | ICD-10-CM

## 2018-07-26 ENCOUNTER — Encounter: Payer: Self-pay | Admitting: Allergy and Immunology

## 2018-07-26 ENCOUNTER — Other Ambulatory Visit: Payer: Self-pay

## 2018-07-26 ENCOUNTER — Ambulatory Visit (INDEPENDENT_AMBULATORY_CARE_PROVIDER_SITE_OTHER): Payer: BC Managed Care – PPO | Admitting: Allergy and Immunology

## 2018-07-26 VITALS — BP 146/110 | HR 85 | Temp 98.4°F | Resp 16

## 2018-07-26 DIAGNOSIS — I10 Essential (primary) hypertension: Secondary | ICD-10-CM | POA: Diagnosis not present

## 2018-07-26 DIAGNOSIS — L500 Allergic urticaria: Secondary | ICD-10-CM

## 2018-07-26 DIAGNOSIS — R5383 Other fatigue: Secondary | ICD-10-CM

## 2018-07-26 DIAGNOSIS — J3089 Other allergic rhinitis: Secondary | ICD-10-CM

## 2018-07-26 MED ORDER — HYDROCORTISONE 2.5 % EX CREA: TOPICAL_CREAM | Freq: Two times a day (BID) | CUTANEOUS | 5 refills | Status: AC

## 2018-07-26 NOTE — Patient Instructions (Addendum)
  1. Use the following every day:   A.  Cetirizine 10 mg -2 tablets twice a day (40 mg daily)  B.  Famotidine 20 mg -1 tablet twice a day  C.  Montelukast 10 mg -1 tablet once a day  2.  Can add Benadryl 25-50 mg every 6 hours if needed  3.  Continue omalizumab 300 mg every 3 weeks  4.  Obtain evaluation for fatigue with primary care doctor.  Anemia? Thyroid?  Testosterone?  Sleep apnea?  Cetirizine?  5.  Establish if recent high blood pressure reading is a trend  6. Return in 6 months or earlier if problem  7.  Obtain full flu vaccine

## 2018-07-26 NOTE — Progress Notes (Addendum)
[place]   Follow-up Note  Referring Provider: Leilani Ableeese, Betti, MD Primary Provider: Leilani Ableeese, Betti, MD Date of Office Visit: 07/26/2018  Subjective:   Gerald Colon (DOB: 07/11/1970) is a 48 y.o. male who [facility] 07[facility]/2018 in re-evaluation of the following:  HPI: Gerald Colon returns to this clinic in reevaluation of chronic urticaria and angioedema and history of allergic rhinitis.  His last visit to this clinic was 18 January 2018.  Overall he feels as though he is doing relatively well on his current plan of therapy which includes high-dose antihistamine use, leukotriene modifier, and consistent use of omalizumab injections.  However, if he misses a dose of his medications the next day he develops urticaria.  He has not had any associated systemic or constitutional symptoms develop since his last visit.  But he states that he is tired.  It seems as though he has this low-grade fatigue.  He will be visiting with his primary care doctor to assess this issue.  Allergies as of 07/26/2018   No Known Allergies     Medication List    aliskiren 300 MG tablet Commonly known as: TEKTURNA Take 300 mg by mouth daily.   amLODipine 5 MG tablet Commonly known as: NORVASC Take 1 tablet (5 mg total) by mouth once. What changed: when to take this   cetirizine 10 MG tablet Commonly known as: ZYRTEC Take 10 mg by mouth 2 (two) times daily.   EPINEPHrine 0.3 mg/0.3 mL Soaj injection Commonly known as: Auvi-Q Use as directed for severe allergic reaction   famotidine 20 MG tablet Commonly known as: Pepcid Take 1 tablet (20 mg total) by mouth 2 (two) times daily.   hydrOXYzine 50 MG capsule Commonly known as: VISTARIL 1 CAPSULE BY ORAL ROUTE 4 TIMES PER DAY FOR 30 DAY(S)   montelukast 10 MG tablet Commonly known as: SINGULAIR Take 1 tablet (10 mg total) by mouth at bedtime.   Tekturna HCT 150-25 MG Tabs  Generic drug: Aliskiren-hydroCHLOROthiazide Take 1 tablet by mouth daily.       Past Medical History:  Diagnosis Date  . Hyperlipemia   . Hypertension   . Obesity   . Urticaria     Past Surgical History:  Procedure Laterality Date  . TONSILLECTOMY      Review of systems negative except as noted in HPI / PMHx or noted below:  Review of Systems  Constitutional: Negative.   HENT: Negative.   Eyes: Negative.   Respiratory: Negative.   Cardiovascular: Negative.   Gastrointestinal: Negative.   Genitourinary: Negative.   Musculoskeletal: Negative.   Skin: Negative.   Neurological: Negative.   Endo/Heme/Allergies: Negative.   Psychiatric/Behavioral: Negative.      Objective:   Vitals:   07/26/18 1035 07/26/18 1058  BP: (!) 160/100 (!) 146/110  Pulse: 85   Resp: 16   Temp: 98.4 F (36.9 C)   SpO2: 95%           Physical Exam Constitutional:      Appearance: He is not diaphoretic.  HENT:     Head: Normocephalic.     Right Ear: Tympanic membrane, ear canal and external ear normal.     Left Ear: Tympanic membrane, ear canal and external ear normal.     Nose: Nose normal. No mucosal edema or rhinorrhea.     Mouth/Throat:     Pharynx: Uvula midline. No oropharyngeal exudate.  Eyes:  Conjunctiva/sclera: Conjunctivae normal.  Neck:     Thyroid: No thyromegaly.     Trachea: Trachea normal. No tracheal tenderness or tracheal deviation.  Cardiovascular:     Rate and Rhythm: Normal rate and regular rhythm.     Heart sounds: Normal heart sounds, S1 normal and S2 normal. No murmur.  Pulmonary:     Effort: No respiratory distress.     Breath sounds: Normal breath sounds. No stridor. No wheezing or rales.  Lymphadenopathy:     Head:     Right side of head: No tonsillar adenopathy.     Left side of head: No tonsillar adenopathy.     Cervical: No cervical adenopathy.  Skin:    Findings: No erythema or rash.     Nails: There is no clubbing.   Neurological:      Mental Status: He is alert.     Diagnostics: none  Assessment and Plan:   1. Allergic urticaria   2. Other allergic rhinitis   3. Other fatigue   4. Essential hypertension     1. Use the following every day:   A.  Cetirizine 10 mg -2 tablets twice a day (40 mg daily)  B.  Famotidine 20 mg -1 tablet twice a day  C.  Montelukast 10 mg -1 tablet once a day  2.  Can add Benadryl 25-50 mg every 6 hours if needed  3.  Continue omalizumab 300 mg every 3 weeks  4.  Obtain evaluation for fatigue with primary care doctor.  Anemia? Thyroid?  Testosterone?  Sleep apnea?  Cetirizine?  5.  Establish if recent high blood pressure reading is a trend  6.  Return in 6 months or earlier if problem  7.  Obtain full flu vaccine  Gerald Colon appears to be doing relatively well on his current therapy and will continue him on this treatment for the next 6 months.  He does have an issue with fatigue and he is going to follow-up with his primary care doctor regarding this issue.  Assuming that evaluation is negative for anemia or thyroid or testosterone deficit or sleep apnea then this fatigue may be secondary to his use of cetirizine.  Then there is a difficult decision to be made as he does definitely receive benefit from the use of this medication.  I will see him back in this clinic in 6 months or earlier if there is a problem.   Allena Katz, MD Allergy / Immunology [place]

## 2018-07-27 ENCOUNTER — Encounter: Payer: Self-pay | Admitting: Allergy and Immunology

## 2018-08-09 ENCOUNTER — Other Ambulatory Visit: Payer: Self-pay

## 2018-08-09 ENCOUNTER — Ambulatory Visit (INDEPENDENT_AMBULATORY_CARE_PROVIDER_SITE_OTHER): Payer: BC Managed Care – PPO | Admitting: *Deleted

## 2018-08-09 DIAGNOSIS — L500 Allergic urticaria: Secondary | ICD-10-CM

## 2018-08-09 MED ORDER — OMALIZUMAB 150 MG ~~LOC~~ SOLR
300.0000 mg | SUBCUTANEOUS | Status: DC
  Administered 2018-08-09 – 2018-09-20 (×3): 300 mg via SUBCUTANEOUS

## 2018-08-18 ENCOUNTER — Other Ambulatory Visit: Payer: Self-pay | Admitting: Allergy and Immunology

## 2018-08-30 ENCOUNTER — Ambulatory Visit (INDEPENDENT_AMBULATORY_CARE_PROVIDER_SITE_OTHER): Payer: BC Managed Care – PPO

## 2018-08-30 ENCOUNTER — Other Ambulatory Visit: Payer: Self-pay

## 2018-08-30 DIAGNOSIS — L500 Allergic urticaria: Secondary | ICD-10-CM | POA: Diagnosis not present

## 2018-09-01 ENCOUNTER — Other Ambulatory Visit: Payer: Self-pay | Admitting: Allergy and Immunology

## 2018-09-20 ENCOUNTER — Ambulatory Visit (INDEPENDENT_AMBULATORY_CARE_PROVIDER_SITE_OTHER): Payer: BC Managed Care – PPO | Admitting: *Deleted

## 2018-09-20 ENCOUNTER — Other Ambulatory Visit: Payer: Self-pay

## 2018-09-20 DIAGNOSIS — L500 Allergic urticaria: Secondary | ICD-10-CM

## 2018-10-11 ENCOUNTER — Ambulatory Visit: Payer: Self-pay | Admitting: *Deleted

## 2018-10-11 ENCOUNTER — Other Ambulatory Visit: Payer: Self-pay

## 2018-10-12 ENCOUNTER — Ambulatory Visit (INDEPENDENT_AMBULATORY_CARE_PROVIDER_SITE_OTHER): Payer: BC Managed Care – PPO | Admitting: *Deleted

## 2018-10-12 ENCOUNTER — Other Ambulatory Visit: Payer: Self-pay | Admitting: *Deleted

## 2018-10-12 DIAGNOSIS — L500 Allergic urticaria: Secondary | ICD-10-CM

## 2018-10-12 MED ORDER — OMALIZUMAB 150 MG ~~LOC~~ SOLR
300.0000 mg | SUBCUTANEOUS | Status: DC
  Administered 2018-10-12 – 2018-11-07 (×2): 300 mg via SUBCUTANEOUS

## 2018-10-12 MED ORDER — EPINEPHRINE 0.3 MG/0.3ML IJ SOAJ: INTRAMUSCULAR | 1 refills | Status: DC

## 2018-11-02 ENCOUNTER — Ambulatory Visit: Payer: BC Managed Care – PPO

## 2018-11-07 ENCOUNTER — Ambulatory Visit (INDEPENDENT_AMBULATORY_CARE_PROVIDER_SITE_OTHER): Payer: BC Managed Care – PPO | Admitting: *Deleted

## 2018-11-07 ENCOUNTER — Other Ambulatory Visit: Payer: Self-pay

## 2018-11-07 DIAGNOSIS — L500 Allergic urticaria: Secondary | ICD-10-CM | POA: Diagnosis not present

## 2018-11-28 ENCOUNTER — Ambulatory Visit: Payer: Self-pay

## 2018-12-05 ENCOUNTER — Telehealth: Payer: Self-pay | Admitting: *Deleted

## 2018-12-05 NOTE — Telephone Encounter (Signed)
Patient was due last week for Xolair but had no Ins per pharmacy.  I had L/M for patient and he returned call that he has new [employer] plan.  I called him to get new Ins info and it is [employer], [employer].  I advised him that he would not be able to come to [place] with this plan for his Xolair injections and would need to see [facility] MD.  Sent message to [facility] to make the referral and advised patient of same.

## 2018-12-06 NOTE — Telephone Encounter (Signed)
Patient is scheduled to see Dr Harrietta Guardian @ 11 on 11/24. I will call and inform the patient of this information. I have faxed his last shot and last ov to their office.   Thanks

## 2018-12-08 NOTE — Telephone Encounter (Signed)
Patient informed. 

## 2018-12-13 ENCOUNTER — Other Ambulatory Visit: Payer: Self-pay | Admitting: Allergy and Immunology

## 2019-01-23 ENCOUNTER — Ambulatory Visit: Payer: BC Managed Care – PPO | Admitting: Allergy and Immunology

## 2019-03-24 ENCOUNTER — Other Ambulatory Visit: Payer: Self-pay

## 2019-03-24 MED ORDER — MONTELUKAST SODIUM 10 MG PO TABS: 10.0000 mg | ORAL_TABLET | Freq: Every day | ORAL | 0 refills | Status: DC

## 2019-03-24 NOTE — Telephone Encounter (Signed)
Courtesy refill note attached to refill.

## 2019-04-24 ENCOUNTER — Other Ambulatory Visit: Payer: Self-pay | Admitting: Allergy and Immunology

## 2019-05-11 ENCOUNTER — Other Ambulatory Visit: Payer: Self-pay | Admitting: Allergy and Immunology

## 2021-02-18 DIAGNOSIS — E663 Overweight: Secondary | ICD-10-CM | POA: Diagnosis not present

## 2021-02-18 DIAGNOSIS — I10 Essential (primary) hypertension: Secondary | ICD-10-CM | POA: Diagnosis not present

## 2021-02-18 DIAGNOSIS — E782 Mixed hyperlipidemia: Secondary | ICD-10-CM | POA: Diagnosis not present

## 2021-02-18 DIAGNOSIS — R5383 Other fatigue: Secondary | ICD-10-CM | POA: Diagnosis not present

## 2021-07-01 DIAGNOSIS — L501 Idiopathic urticaria: Secondary | ICD-10-CM | POA: Diagnosis not present

## 2022-03-23 DIAGNOSIS — R799 Abnormal finding of blood chemistry, unspecified: Secondary | ICD-10-CM | POA: Diagnosis not present

## 2022-03-23 DIAGNOSIS — E669 Obesity, unspecified: Secondary | ICD-10-CM | POA: Diagnosis not present

## 2022-03-23 DIAGNOSIS — I129 Hypertensive chronic kidney disease with stage 1 through stage 4 chronic kidney disease, or unspecified chronic kidney disease: Secondary | ICD-10-CM | POA: Diagnosis not present

## 2022-03-23 DIAGNOSIS — N1832 Chronic kidney disease, stage 3b: Secondary | ICD-10-CM | POA: Diagnosis not present

## 2022-03-31 DIAGNOSIS — N189 Chronic kidney disease, unspecified: Secondary | ICD-10-CM | POA: Diagnosis not present

## 2022-03-31 DIAGNOSIS — I129 Hypertensive chronic kidney disease with stage 1 through stage 4 chronic kidney disease, or unspecified chronic kidney disease: Secondary | ICD-10-CM | POA: Diagnosis not present

## 2022-03-31 DIAGNOSIS — R7989 Other specified abnormal findings of blood chemistry: Secondary | ICD-10-CM | POA: Diagnosis not present

## 2022-03-31 DIAGNOSIS — L501 Idiopathic urticaria: Secondary | ICD-10-CM | POA: Diagnosis not present

## 2022-04-16 ENCOUNTER — Ambulatory Visit: Payer: BLUE CROSS/BLUE SHIELD | Admitting: Internal Medicine

## 2022-04-28 ENCOUNTER — Other Ambulatory Visit (HOSPITAL_COMMUNITY): Payer: Self-pay

## 2022-04-28 DIAGNOSIS — E782 Mixed hyperlipidemia: Secondary | ICD-10-CM | POA: Diagnosis not present

## 2022-04-28 DIAGNOSIS — E119 Type 2 diabetes mellitus without complications: Secondary | ICD-10-CM | POA: Diagnosis not present

## 2022-04-28 DIAGNOSIS — L501 Idiopathic urticaria: Secondary | ICD-10-CM | POA: Diagnosis not present

## 2022-04-28 DIAGNOSIS — I10 Essential (primary) hypertension: Secondary | ICD-10-CM | POA: Diagnosis not present

## 2022-04-28 MED ORDER — WEGOVY 0.25 MG/0.5ML ~~LOC~~ SOAJ
0.2500 mg | SUBCUTANEOUS | 1 refills | Status: DC
  Filled 2022-04-28: qty 3, 28d supply, fill #0

## 2022-04-30 DIAGNOSIS — I10 Essential (primary) hypertension: Secondary | ICD-10-CM | POA: Diagnosis not present

## 2022-04-30 DIAGNOSIS — I872 Venous insufficiency (chronic) (peripheral): Secondary | ICD-10-CM | POA: Diagnosis not present

## 2022-04-30 DIAGNOSIS — I129 Hypertensive chronic kidney disease with stage 1 through stage 4 chronic kidney disease, or unspecified chronic kidney disease: Secondary | ICD-10-CM | POA: Diagnosis not present

## 2022-04-30 DIAGNOSIS — R0602 Shortness of breath: Secondary | ICD-10-CM | POA: Diagnosis not present

## 2022-05-01 DIAGNOSIS — R9431 Abnormal electrocardiogram [ECG] [EKG]: Secondary | ICD-10-CM | POA: Diagnosis not present

## 2022-05-05 DIAGNOSIS — N1832 Chronic kidney disease, stage 3b: Secondary | ICD-10-CM | POA: Diagnosis not present

## 2022-05-05 DIAGNOSIS — I129 Hypertensive chronic kidney disease with stage 1 through stage 4 chronic kidney disease, or unspecified chronic kidney disease: Secondary | ICD-10-CM | POA: Diagnosis not present

## 2022-05-11 ENCOUNTER — Ambulatory Visit: Payer: BLUE CROSS/BLUE SHIELD | Attending: Internal Medicine | Admitting: Internal Medicine

## 2022-05-15 ENCOUNTER — Encounter: Payer: Self-pay | Admitting: *Deleted

## 2022-08-14 DIAGNOSIS — N1832 Chronic kidney disease, stage 3b: Secondary | ICD-10-CM | POA: Diagnosis not present

## 2022-08-14 DIAGNOSIS — I129 Hypertensive chronic kidney disease with stage 1 through stage 4 chronic kidney disease, or unspecified chronic kidney disease: Secondary | ICD-10-CM | POA: Diagnosis not present

## 2022-08-14 DIAGNOSIS — E669 Obesity, unspecified: Secondary | ICD-10-CM | POA: Diagnosis not present

## 2022-08-27 ENCOUNTER — Encounter: Payer: Self-pay | Admitting: Internal Medicine

## 2022-08-27 ENCOUNTER — Ambulatory Visit: Payer: BLUE CROSS/BLUE SHIELD | Attending: Internal Medicine | Admitting: Internal Medicine

## 2022-08-27 VITALS — BP 134/88 | HR 80 | Ht 74.0 in | Wt >= 6400 oz

## 2022-08-27 DIAGNOSIS — I10 Essential (primary) hypertension: Secondary | ICD-10-CM | POA: Diagnosis not present

## 2022-08-27 NOTE — Patient Instructions (Signed)
Medication Instructions:  Your physician recommends that you continue on your current medications as directed. Please refer to the Current Medication list given to you today.  *If you need a refill on your cardiac medications before your next appointment, please call your pharmacy*   Testing/Procedures: Your physician has requested that you have an echocardiogram. Echocardiography is a painless test that uses sound waves to create images of your heart. It provides your doctor with information about the size and shape of your heart and how well your heart's chambers and valves are working. This procedure takes approximately one hour. There are no restrictions for this procedure. Please do NOT wear cologne, perfume, aftershave, or lotions (deodorant is allowed). Please arrive 15 minutes prior to your appointment time.  Follow-Up: At [facility], you and your health needs are our priority.  As part of our continuing mission to provide you with exceptional heart care, we have created designated Provider Care Teams.  These Care Teams include your primary Cardiologist (physician) and Advanced Practice Providers (APPs -  Physician Assistants and Nurse Practitioners) who all work together to provide you with the care you need, when you need it.   Your next appointment:   As needed  Provider:   Dr Wyline Mood

## 2022-08-27 NOTE — Progress Notes (Signed)
Cardiology Office Note:    Date:  08/27/2022   ID:  Gerald Colon, DOB 1970/06/07, MRN [id number]  PCP:  Leilani Able, MD   [facility] Providers Cardiologist:  None     Referring MD: Leilani Able, MD   No chief complaint on file. HTN  History of Present Illness:    Gerald Colon is a 52 y.o. male with a hx of CKD Stage III, referral for HTN. He is 426 pounds.  No premature CAD history. No family hx of heart failure. Past Medical History:  Diagnosis Date   Hyperlipemia    Hypertension    Obesity    Urticaria     Past Surgical History:  Procedure Laterality Date   TONSILLECTOMY      Current Medications: Current Outpatient Medications on File Prior to Visit  Medication Sig Dispense Refill   amLODipine (NORVASC) 5 MG tablet Take 1 tablet (5 mg total) by mouth once. (Patient taking differently: Take 5 mg by mouth daily.) 30 tablet 1   cetirizine (ZYRTEC) 10 MG tablet Take 10 mg by mouth 2 (two) times daily.     famotidine (PEPCID) 20 MG tablet TAKE 1 TABLET BY MOUTH TWICE A DAY 180 tablet 0   hydrocortisone 2.5 % cream Apply topically 2 (two) times daily. 60 g 5   hydrOXYzine (VISTARIL) 50 MG capsule 1 CAPSULE BY ORAL ROUTE 4 TIMES PER DAY FOR 30 DAY(S)  3   montelukast (SINGULAIR) 10 MG tablet Take 1 tablet (10 mg total) by mouth at bedtime. 30 tablet 0   Semaglutide-Weight Management (WEGOVY) 0.25 MG/0.5ML SOAJ Inject 0.25 mg into the skin once a week. 3 mL 1   TEKTURNA HCT 150-25 MG TABS Take 1 tablet by mouth daily.     [DISCONTINUED] fluticasone (FLONASE) 50 MCG/ACT nasal spray Place 2 sprays into both nostrils daily. 16 g 0   Current Facility-Administered Medications on File Prior to Visit  Medication Dose Route Frequency Provider Last Rate Last Admin   omalizumab Geoffry Paradise) injection 300 mg  300 mg Subcutaneous Q21 days Jessica Priest, MD   300 mg at 11/07/18 1542    Allergies:   Patient has no known allergies.   Social History   Socioeconomic  History   Marital status: Married    Spouse name: Not on file   Number of children: Not on file   Years of education: Not on file   Highest education level: Not on file  Occupational History   Occupation: Primary school teacher  Tobacco Use   Smoking status: Former    Types: Cigars    Quit date: 02/03/2000    Years since quitting: 22.5   Smokeless tobacco: Never   Tobacco comments:    smoked once or twice a month  Substance and Sexual Activity   Alcohol use: No    Alcohol/week: 0.0 standard drinks of alcohol   Drug use: No   Sexual activity: Not on file  Other Topics Concern   Not on file  Social History Narrative   Not on file   Social Determinants of Health   Financial Resource Strain: Not on file  Food Insecurity: Not on file  Transportation Needs: Not on file  Physical Activity: Not on file  Stress: Not on file  Social Connections: Not on file     Family History: The patient's family history includes Diabetes in his father; Heart disease in his maternal grandfather; High blood pressure in his mother; Other in his mother.  ROS:  Please see the history of present illness.     All other systems reviewed and are negative.  EKGs/Labs/Other Studies Reviewed:    The following studies were reviewed today:  EKG Interpretation Date/Time:  Thursday August 27 2022 13:55:49 EDT Ventricular Rate:  69 PR Interval:  150 QRS Duration:  86 QT Interval:  414 QTC Calculation: 443 R Axis:   17  Text Interpretation: Normal sinus rhythm Inferior infarct , age undetermined When compared with ECG of 21-Apr-2015 06:33, Inferior infarct is now Present Inverted T waves have replaced nonspecific T wave abnormality in Inferior leads Confirmed by Carolan Clines ([id number]) on 08/27/2022 1:58:17 PM Had inferior Q waves before      Recent Labs: No results found for requested labs within last 365 days.   Recent Lipid Panel No results found for: "CHOL", "TRIG", "HDL", "CHOLHDL", "VLDL", "LDLCALC",  "LDLDIRECT"   Risk Assessment/Calculations:     Physical Exam:    VS:   Vitals:   08/27/22 1353  BP: 134/88  Pulse: 80  SpO2: 95%     Wt Readings from Last 3 Encounters:  10/19/17 (!) 435 lb (197.3 kg)  12/16/16 (!) 437 lb 6.4 oz (198.4 kg)  07/26/15 (!) 416 lb (188.7 kg)     GEN: obese,  Well nourished, well developed in no acute distress HEENT: Normal LYMPHATICS: No lymphadenopathy CARDIAC: RRR, no murmurs, rubs, gallops RESPIRATORY:  Clear to auscultation without rales, wheezing or rhonchi  ABDOMEN: Soft, non-tender, non-distended MUSCULOSKELETAL:  No edema; No deformity  SKIN: Warm and dry NEUROLOGIC:  Alert and oriented x 3 PSYCHIATRIC:  Normal affect   ASSESSMENT:    HTN Obesity Dependent Edema EKG with inferior infarct pattern  PLAN:    In order of problems listed above:  HTN well controlled at home, no recommended changes Recommend continuing HTN management with a nephrologist considering his CKD  Planned for  wegovy Healthy Weight and Wellness referral Compression stockings Sleep study referral to pulm TTE Limited with inferior infarct pattern; if normal he does not require further cardiac w/u. No hx of CHF. Clinically assessment of decompensated HFpEF is challenging considering his weight Follow up PRN        Medication Adjustments/Labs and Tests Ordered: Current medicines are reviewed at length with the patient today.  Concerns regarding medicines are outlined above.  No orders of the defined types were placed in this encounter.  No orders of the defined types were placed in this encounter.   There are no Patient Instructions on file for this visit.   Signed, Maisie Fus, MD  08/27/2022 11:52 AM    [facility]

## 2022-09-25 ENCOUNTER — Ambulatory Visit (HOSPITAL_COMMUNITY): Payer: BLUE CROSS/BLUE SHIELD

## 2022-10-16 ENCOUNTER — Ambulatory Visit (HOSPITAL_COMMUNITY): Payer: BC Managed Care – PPO | Attending: Internal Medicine

## 2022-10-16 DIAGNOSIS — I10 Essential (primary) hypertension: Secondary | ICD-10-CM | POA: Insufficient documentation

## 2022-10-16 LAB — ECHOCARDIOGRAM LIMITED
Area-P 1/2: 3.27 cm2
S' Lateral: 2.8 cm

## 2022-11-19 ENCOUNTER — Encounter: Payer: Self-pay | Admitting: Primary Care

## 2022-11-19 ENCOUNTER — Ambulatory Visit (INDEPENDENT_AMBULATORY_CARE_PROVIDER_SITE_OTHER): Payer: BC Managed Care – PPO | Admitting: Primary Care

## 2022-11-19 VITALS — BP 122/80 | HR 80 | Temp 98.3°F | Ht 74.0 in | Wt >= 6400 oz

## 2022-11-19 DIAGNOSIS — R0683 Snoring: Secondary | ICD-10-CM

## 2022-11-19 NOTE — Progress Notes (Signed)
@Patient  ID: Gerald Colon, male    DOB: 10-12-1970, 53 y.o.   MRN: [id number]  Chief Complaint  Patient presents with   Consult    Sleep consult-snoring, restless, tired    Referring provider: Maisie Fus, MD  HPI: 52 year old male, former smoker.  Past medical history significant for HTN, obesity.  11/19/2022 Referred by nephrology to cardiology due to HTN. He was referred by cardiology for sleep consult. He takes amlodipine and enalapril-hydrochlorothiazide for his blood pressure. Readings have been well controlled recently. He reports restless sleep, snoring and daytime fatigue. He mainly sleeps on his back or side. Typical bedtime between 11-1am. It takes him <1-2 minutes to fall asleep. He wakes up between 2-3 times a night/ His wifge will wake him up if he is snoring. He starts his day between 6-8am. He will nap on average three times a week. He falls asleep easily if inactive. He has never fallen asleep while driving. Weight is up last 2 years. His uncle and possibly his mother have sleep apnea and wear CPAP of some sort. Denies narcolepsy, catgaplexy or sleep walking     No Known Allergies   There is no immunization history on file for this patient.  Past Medical History:  Diagnosis Date   Hyperlipemia    Hypertension    Obesity    Urticaria     Tobacco History: Social History   Tobacco Use  Smoking Status Former   Types: Cigars   Quit date: 02/03/2000   Years since quitting: 22.8  Smokeless Tobacco Never  Tobacco Comments   smoked once or twice a month   Counseling given: Not Answered Tobacco comments: smoked once or twice a month   Outpatient Medications Prior to Visit  Medication Sig Dispense Refill   amLODipine (NORVASC) 5 MG tablet Take 1 tablet (5 mg total) by mouth once. (Patient taking differently: Take 5 mg by mouth daily.) 30 tablet 1   cetirizine (ZYRTEC) 10 MG tablet Take 10 mg by mouth 2 (two) times daily.     enalapril-hydrochlorothiazide  (VASERETIC) 10-25 MG tablet Take 1 tablet by mouth daily.     famotidine (PEPCID) 20 MG tablet TAKE 1 TABLET BY MOUTH TWICE A DAY 180 tablet 0   hydrocortisone 2.5 % cream Apply topically 2 (two) times daily. 60 g 5   hydrOXYzine (VISTARIL) 50 MG capsule 1 CAPSULE BY ORAL ROUTE 4 TIMES PER DAY FOR 30 DAY(S) (Patient not taking: Reported on 11/19/2022)  3   montelukast (SINGULAIR) 10 MG tablet Take 1 tablet (10 mg total) by mouth at bedtime. 30 tablet 0   Semaglutide-Weight Management (WEGOVY) 0.25 MG/0.5ML SOAJ Inject 0.25 mg into the skin once a week. 3 mL 1   TEKTURNA HCT 150-25 MG TABS Take 1 tablet by mouth daily.     omalizumab Geoffry Paradise) injection 300 mg      No facility-administered medications prior to visit.      Review of Systems  Review of Systems  Constitutional:  Positive for fatigue.  HENT: Negative.    Respiratory: Negative.    Cardiovascular: Negative.   Psychiatric/Behavioral:  Positive for sleep disturbance.      Physical Exam  BP 122/80 (BP Location: Left Wrist, Cuff Size: Large)   Pulse 80   Temp 98.3 F (36.8 C) (Temporal)   Ht 6\' 2"  (1.88 m)   Wt (!) 453 lb 6.4 oz (205.7 kg)   SpO2 99%   BMI 58.21 kg/m  Physical Exam Constitutional:  General: He is not in acute distress.    Appearance: Normal appearance. He is obese. He is not ill-appearing.  HENT:     Head: Normocephalic and atraumatic.     Mouth/Throat:     Mouth: Mucous membranes are moist.     Pharynx: Oropharynx is clear.  Cardiovascular:     Rate and Rhythm: Normal rate and regular rhythm.  Pulmonary:     Effort: Pulmonary effort is normal.     Breath sounds: Normal breath sounds.  Musculoskeletal:        General: Normal range of motion.  Skin:    General: Skin is warm and dry.  Neurological:     General: No focal deficit present.     Mental Status: He is alert and oriented to person, place, and time. Mental status is at baseline.  Psychiatric:        Mood and Affect: Mood  normal.        Behavior: Behavior normal.        Thought Content: Thought content normal.        Judgment: Judgment normal.      Lab Results:  CBC    Component Value Date/Time   WBC 13.6 (H) 12/16/2016 1023   WBC 7.4 05/24/2015 0625   RBC 4.93 12/16/2016 1023   RBC 5.15 05/24/2015 0625   HGB 14.0 12/16/2016 1023   HCT 41.8 12/16/2016 1023   PLT 239 12/16/2016 1023   MCV 85 12/16/2016 1023   MCH 28.4 12/16/2016 1023   MCH 27.4 05/24/2015 0625   MCHC 33.5 12/16/2016 1023   MCHC 32.1 05/24/2015 0625   RDW 15.5 (H) 12/16/2016 1023   LYMPHSABS 2.2 12/16/2016 1023   MONOABS 0.7 06/13/2014 0733   EOSABS 0.0 12/16/2016 1023   BASOSABS 0.0 12/16/2016 1023    BMET    Component Value Date/Time   NA 141 12/16/2016 1023   K 4.0 12/16/2016 1023   CL 103 12/16/2016 1023   CO2 26 12/16/2016 1023   GLUCOSE 99 12/16/2016 1023   GLUCOSE 110 (H) 04/21/2015 0708   BUN 15 12/16/2016 1023   CREATININE 1.22 12/16/2016 1023   CALCIUM 9.5 12/16/2016 1023   GFRNONAA 70 12/16/2016 1023   GFRAA 81 12/16/2016 1023    BNP No results found for: "BNP"  ProBNP No results found for: "PROBNP"  Imaging: No results found.   Assessment & Plan:   No problem-specific Assessment & Plan notes found for this encounter.    Sleep apnea is defined as period of 10 seconds or longer when you stop breathing at night. This can happen multiple times a night. Dx sleep apnea is when this occurs more than 5 times an hour.    Mild OSA 5-15 apneic events an hour Moderate OSA 15-30 apneic events an hour Severe OSA > 30 apneic events an hour   Untreated sleep apnea puts you at higher risk for cardiac arrhythmias, pulmonary HTN, stroke and diabetes  Treatment options include weight loss, side sleeping position, oral appliance, CPAP therapy or referral to ENT for possible surgical options    Recommendations: Focus on side sleeping position or elevate head with wedge pillow 30 degrees Work on weight  loss efforts if able  Do not drive if experiencing excessive daytime sleepiness of fatigue    Orders: Home sleep study re: loud snoring    Follow-up: Please call to schedule follow-up 1-2 weeks after completing home sleep study to review results and treatment if needed (can be virtual)  Glenford Bayley, NP 11/19/2022

## 2022-11-19 NOTE — Patient Instructions (Signed)
Sleep apnea is defined as period of 10 seconds or longer when you stop breathing at night. This can happen multiple times a night. Dx sleep apnea is when this occurs more than 5 times an hour.    Mild OSA 5-15 apneic events an hour Moderate OSA 15-30 apneic events an hour Severe OSA > 30 apneic events an hour   Untreated sleep apnea puts you at higher risk for cardiac arrhythmias, pulmonary HTN, stroke and diabetes  Treatment options include weight loss, side sleeping position, oral appliance, CPAP therapy or referral to ENT for possible surgical options    Recommendations: Focus on side sleeping position or elevate head with wedge pillow 30 degrees Work on weight loss efforts if able  Do not drive if experiencing excessive daytime sleepiness of fatigue    Orders: Home sleep study re: loud snoring    Follow-up: Please call to schedule follow-up 1-2 weeks after completing home sleep study to review results and treatment if needed (can be virtual)  Sleep Apnea Sleep apnea affects breathing during sleep. It causes breathing to stop for 10 seconds or more, or to become shallow. People with sleep apnea usually snore loudly. It can also increase the risk of: Heart attack. Stroke. Being very overweight (obese). Diabetes. Heart failure. Irregular heartbeat. High blood pressure. The goal of treatment is to help you breathe normally again. What are the causes?  The most common cause of this condition is a collapsed or blocked airway. There are three kinds of sleep apnea: Obstructive sleep apnea. This is caused by a blocked or collapsed airway. Central sleep apnea. This happens when the brain does not send the right signals to the muscles that control breathing. Mixed sleep apnea. This is a combination of obstructive and central sleep apnea. What increases the risk? Being overweight. Smoking. Having a small airway. Being older. Being male. Drinking alcohol. Taking medicines to  calm yourself (sedatives or tranquilizers). Having family members with the condition. Having a tongue or tonsils that are larger than normal. What are the signs or symptoms? Trouble staying asleep. Loud snoring. Headaches in the morning. Waking up gasping. Dry mouth or sore throat in the morning. Being sleepy or tired during the day. If you are sleepy or tired during the day, you may also: Not be able to focus your mind (concentrate). Forget things. Get angry a lot and have mood swings. Feel sad (depressed). Have changes in your personality. Have less interest in sex, if you are male. Be unable to have an erection, if you are male. How is this treated?  Sleeping on your side. Using a medicine to get rid of mucus in your nose (decongestant). Avoiding the use of alcohol, medicines to help you relax, or certain pain medicines (narcotics). Losing weight, if needed. Changing your diet. Quitting smoking. Using a machine to open your airway while you sleep, such as: An oral appliance. This is a mouthpiece that shifts your lower jaw forward. A CPAP device. This device blows air through a mask when you breathe out (exhale). An EPAP device. This has valves that you put in each nostril. A BIPAP device. This device blows air through a mask when you breathe in (inhale) and breathe out. Having surgery if other treatments do not work. Follow these instructions at home: Lifestyle Make changes that your doctor recommends. Eat a healthy diet. Lose weight if needed. Avoid alcohol, medicines to help you relax, and some pain medicines. Do not smoke or use any products that contain  nicotine or tobacco. If you need help quitting, ask your doctor. General instructions Take over-the-counter and prescription medicines only as told by your doctor. If you were given a machine to use while you sleep, use it only as told by your doctor. If you are having surgery, make sure to tell your doctor you have  sleep apnea. You may need to bring your device with you. Keep all follow-up visits. Contact a doctor if: The machine that you were given to use during sleep bothers you or does not seem to be working. You do not get better. You get worse. Get help right away if: Your chest hurts. You have trouble breathing in enough air. You have an uncomfortable feeling in your back, arms, or stomach. You have trouble talking. One side of your body feels weak. A part of your face is hanging down. These symptoms may be an emergency. Get help right away. Call your local emergency services (911 in the [place] Do not wait to see if the symptoms will go away. Do not drive yourself to the hospital. Summary This condition affects breathing during sleep. The most common cause is a collapsed or blocked airway. The goal of treatment is to help you breathe normally while you sleep. This information is not intended to replace advice given to you by your health care provider. Make sure you discuss any questions you have with your health care provider. Document Revised: 08/28/2020 Document Reviewed: 12/29/2019 Elsevier Patient Education  2024 [employer].

## 2022-11-30 ENCOUNTER — Ambulatory Visit: Payer: BC Managed Care – PPO | Admitting: Adult Health

## 2022-11-30 DIAGNOSIS — R0683 Snoring: Secondary | ICD-10-CM

## 2022-11-30 DIAGNOSIS — G4730 Sleep apnea, unspecified: Secondary | ICD-10-CM | POA: Diagnosis not present

## 2023-01-01 ENCOUNTER — Telehealth: Payer: Self-pay | Admitting: Pulmonary Disease

## 2023-01-01 NOTE — Telephone Encounter (Signed)
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

## 2023-01-04 NOTE — Telephone Encounter (Signed)
Called patient.  Gave information.  Patient verbalized understanding.  Scheduled patient OV for January 2025.

## 2023-01-04 NOTE — Telephone Encounter (Signed)
Please let patient know HST 11/30/22 showed moderate OSA, please set up virtual visit to discuss. Do not double book. First available.

## 2023-02-02 NOTE — Progress Notes (Signed)
Please let patient know sleep study showed moderate obstructive sleep apnea with an average of 17 apneic events per hour. Recommend patient be started on CPAP 5 to 20 cm H2O with mask of choice OR referred to orthodontic dentist.  Please let me know what patient decides.  Will need follow-up in 3 months.  Patient has additional questions or would like to review testing please set up for virtual visit

## 2023-02-11 ENCOUNTER — Encounter (INDEPENDENT_AMBULATORY_CARE_PROVIDER_SITE_OTHER): Payer: Self-pay | Admitting: Adult Health

## 2023-03-03 ENCOUNTER — Encounter: Payer: Self-pay | Admitting: Adult Health

## 2023-03-03 ENCOUNTER — Ambulatory Visit: Payer: BC Managed Care – PPO | Admitting: Adult Health

## 2023-03-03 VITALS — BP 120/60 | HR 75 | Ht 74.0 in | Wt >= 6400 oz

## 2023-03-03 DIAGNOSIS — G4733 Obstructive sleep apnea (adult) (pediatric): Secondary | ICD-10-CM

## 2023-03-03 NOTE — Progress Notes (Signed)
@Patient  ID: Gerald Colon, male    DOB: 01-26-1971, 53 y.o.   MRN: [id number]  Chief Complaint  Patient presents with   Follow-up    Referring provider: Leilani Able, MD  HPI: 53 year old male former smoker seen for sleep consult October 2024 for snoring and daytime sleepiness found to have moderate obstructive sleep apnea  TEST/EVENTS :  Home sleep study November 30, 2022 showed moderate sleep apnea with an AHI at 17.9/hour and SpO2 low at 77%.  03/03/2023 Follow up ; OSA  Patient presents for a follow-up visit.  Patient was seen October 2024 for snoring and daytime sleepiness.  Patient was set up for home sleep study that was completed on November 30, 2022.  This showed moderate sleep apnea with AHI at 17.9/hour and SpO2 low at 77%.  We discussed his sleep study results in detail.  Went over treatment options including weight loss and CPAP therapy.  Patient is in agreement to begin CPAP.  Patient education was given on sleep apnea and CPAP care.  Has morbid obesity with BMI at 58.5. We discussed in detail healthy weight loss. Discussed potential weight loss options.     No Known Allergies   There is no immunization history on file for this patient.  Past Medical History:  Diagnosis Date   Hyperlipemia    Hypertension    Obesity    Urticaria     Tobacco History: Social History   Tobacco Use  Smoking Status Former   Types: Cigars   Quit date: 02/03/2000   Years since quitting: 23.0  Smokeless Tobacco Never  Tobacco Comments   smoked once or twice a month   Counseling given: Not Answered Tobacco comments: smoked once or twice a month   Outpatient Medications Prior to Visit  Medication Sig Dispense Refill   amLODipine (NORVASC) 5 MG tablet Take 1 tablet (5 mg total) by mouth once. (Patient taking differently: Take 5 mg by mouth daily.) 30 tablet 1   cetirizine (ZYRTEC) 10 MG tablet Take 10 mg by mouth 2 (two) times daily.     enalapril-hydrochlorothiazide  (VASERETIC) 10-25 MG tablet Take 1 tablet by mouth daily.     famotidine (PEPCID) 20 MG tablet TAKE 1 TABLET BY MOUTH TWICE A DAY 180 tablet 0   hydrocortisone 2.5 % cream Apply topically 2 (two) times daily. 60 g 5   No facility-administered medications prior to visit.     Review of Systems:   Constitutional:   No  weight loss, night sweats,  Fevers, chills, fatigue, or  lassitude.  HEENT:   No headaches,  Difficulty swallowing,  Tooth/dental problems, or  Sore throat,                No sneezing, itching, ear ache, nasal congestion, post nasal drip,   CV:  No chest pain,  Orthopnea, PND, swelling in lower extremities, anasarca, dizziness, palpitations, syncope.   GI  No heartburn, indigestion, abdominal pain, nausea, vomiting, diarrhea, change in bowel habits, loss of appetite, bloody stools.   Resp: No shortness of breath with exertion or at rest.  No excess mucus, no productive cough,  No non-productive cough,  No coughing up of blood.  No change in color of mucus.  No wheezing.  No chest wall deformity  Skin: no rash or lesions.  GU: no dysuria, change in color of urine, no urgency or frequency.  No flank pain, no hematuria   MS:  No joint pain or swelling.  No decreased  range of motion.  No back pain.    Physical Exam  BP 120/60 (BP Location: Left Arm, Patient Position: Sitting, Cuff Size: Normal)   Pulse 75   Ht 6\' 2"  (1.88 m)   Wt (!) 455 lb 9.6 oz (206.7 kg)   SpO2 96%   BMI 58.50 kg/m   GEN: A/Ox3; pleasant , NAD, well nourished    HEENT:  /AT,  EACs-clear, TMs-wnl, NOSE-clear, THROAT-clear, no lesions, no postnasal drip or exudate noted.   NECK:  Supple w/ fair ROM; no JVD; normal carotid impulses w/o bruits; no thyromegaly or nodules palpated; no lymphadenopathy.    RESP  Clear  P & A; w/o, wheezes/ rales/ or rhonchi. no accessory muscle use, no dullness to percussion  CARD:  RRR, no m/r/g, no peripheral edema, pulses intact, no cyanosis or clubbing.  GI:    Soft & nt; nml bowel sounds; no organomegaly or masses detected.   Musco: Warm bil, no deformities or joint swelling noted.   Neuro: alert, no focal deficits noted.    Skin: Warm, no lesions or rashes    Lab Results:  CBC    Component Value Date/Time   WBC 13.6 (H) 12/16/2016 1023   WBC 7.4 05/24/2015 0625   RBC 4.93 12/16/2016 1023   RBC 5.15 05/24/2015 0625   HGB 14.0 12/16/2016 1023   HCT 41.8 12/16/2016 1023   PLT 239 12/16/2016 1023   MCV 85 12/16/2016 1023   MCH 28.4 12/16/2016 1023   MCH 27.4 05/24/2015 0625   MCHC 33.5 12/16/2016 1023   MCHC 32.1 05/24/2015 0625   RDW 15.5 (H) 12/16/2016 1023   LYMPHSABS 2.2 12/16/2016 1023   MONOABS 0.7 06/13/2014 0733   EOSABS 0.0 12/16/2016 1023   BASOSABS 0.0 12/16/2016 1023    BMET    Component Value Date/Time   NA 141 12/16/2016 1023   K 4.0 12/16/2016 1023   CL 103 12/16/2016 1023   CO2 26 12/16/2016 1023   GLUCOSE 99 12/16/2016 1023   GLUCOSE 110 (H) 04/21/2015 0708   BUN 15 12/16/2016 1023   CREATININE 1.22 12/16/2016 1023   CALCIUM 9.5 12/16/2016 1023   GFRNONAA 70 12/16/2016 1023   GFRAA 81 12/16/2016 1023    BNP No results found for: "BNP"  ProBNP No results found for: "PROBNP"  Imaging: No results found.  Administration History     None           No data to display          No results found for: "NITRICOXIDE"      Assessment & Plan:   No problem-specific Assessment & Plan notes found for this encounter.    I spent   30 minutes dedicated to the care of this patient on the date of this encounter to include pre-visit review of records, face-to-face time with the patient discussing conditions above, post visit ordering of testing, clinical documentation with the electronic health record, making appropriate referrals as documented, and communicating necessary findings to members of the patients care team.   Rubye Oaks, NP 03/03/2023

## 2023-03-03 NOTE — Patient Instructions (Addendum)
Begin CPAP at bedtime, wear all night long for at least 6 or more hours Work on healthy weight loss Do not drive if sleepy May like dream wear nasal mask or dream wear nasal pillows  Discuss with Primary MD regarding weight loss and to see if Zepbound is an option for you.  Follow-up in 3 months and as needed

## 2023-03-30 ENCOUNTER — Encounter (INDEPENDENT_AMBULATORY_CARE_PROVIDER_SITE_OTHER): Payer: Self-pay | Admitting: Physician Assistant

## 2023-04-06 DIAGNOSIS — E782 Mixed hyperlipidemia: Secondary | ICD-10-CM | POA: Diagnosis not present

## 2023-04-06 DIAGNOSIS — N183 Chronic kidney disease, stage 3 unspecified: Secondary | ICD-10-CM | POA: Diagnosis not present

## 2023-04-06 DIAGNOSIS — I10 Essential (primary) hypertension: Secondary | ICD-10-CM | POA: Diagnosis not present

## 2023-04-06 DIAGNOSIS — E119 Type 2 diabetes mellitus without complications: Secondary | ICD-10-CM | POA: Diagnosis not present

## 2023-04-14 DIAGNOSIS — G4733 Obstructive sleep apnea (adult) (pediatric): Secondary | ICD-10-CM | POA: Diagnosis not present

## 2023-04-28 ENCOUNTER — Ambulatory Visit (INDEPENDENT_AMBULATORY_CARE_PROVIDER_SITE_OTHER): Payer: Self-pay | Admitting: Internal Medicine

## 2023-04-28 ENCOUNTER — Encounter (INDEPENDENT_AMBULATORY_CARE_PROVIDER_SITE_OTHER): Payer: Self-pay | Admitting: Internal Medicine

## 2023-04-28 VITALS — BP 121/70 | HR 72 | Temp 99.2°F | Ht 73.0 in | Wt >= 6400 oz

## 2023-04-28 DIAGNOSIS — R944 Abnormal results of kidney function studies: Secondary | ICD-10-CM | POA: Insufficient documentation

## 2023-04-28 DIAGNOSIS — I158 Other secondary hypertension: Secondary | ICD-10-CM | POA: Insufficient documentation

## 2023-04-28 DIAGNOSIS — I10 Essential (primary) hypertension: Secondary | ICD-10-CM | POA: Insufficient documentation

## 2023-04-28 DIAGNOSIS — N1831 Chronic kidney disease, stage 3a: Secondary | ICD-10-CM | POA: Insufficient documentation

## 2023-04-28 DIAGNOSIS — R7303 Prediabetes: Secondary | ICD-10-CM | POA: Insufficient documentation

## 2023-04-28 DIAGNOSIS — E66813 Obesity, class 3: Secondary | ICD-10-CM | POA: Insufficient documentation

## 2023-04-28 DIAGNOSIS — N189 Chronic kidney disease, unspecified: Secondary | ICD-10-CM

## 2023-04-28 DIAGNOSIS — G4733 Obstructive sleep apnea (adult) (pediatric): Secondary | ICD-10-CM | POA: Diagnosis not present

## 2023-04-28 DIAGNOSIS — E1159 Type 2 diabetes mellitus with other circulatory complications: Secondary | ICD-10-CM | POA: Insufficient documentation

## 2023-04-28 DIAGNOSIS — Z6841 Body Mass Index (BMI) 40.0 and over, adult: Secondary | ICD-10-CM

## 2023-04-28 DIAGNOSIS — E669 Obesity, unspecified: Secondary | ICD-10-CM

## 2023-04-28 DIAGNOSIS — I129 Hypertensive chronic kidney disease with stage 1 through stage 4 chronic kidney disease, or unspecified chronic kidney disease: Secondary | ICD-10-CM | POA: Diagnosis not present

## 2023-04-28 DIAGNOSIS — Z0289 Encounter for other administrative examinations: Secondary | ICD-10-CM

## 2023-04-28 NOTE — Progress Notes (Signed)
 Office: [phone]  /  Fax: [phone]   Initial Visit  Gerald Colon was seen in clinic today to evaluate for obesity. He is interested in losing weight to improve overall health and reduce the risk of weight related complications. He presents today to review program treatment options, initial physical assessment, and evaluation.     Discussed the use of AI scribe software for clinical note transcription with the patient, who gave verbal consent to proceed.  History of Present Illness Gerald Colon is a 53 year old male with obesity who presents for medical weight management. He was referred by Dr. Marisue Humble or Dr. Pecola Leisure for weight management.  He experiences shortness of breath and decreased energy levels, which he attributes to his weight. Activities such as yard work have become more difficult, and he often feels tired and sleepy. He previously tried a weight loss medication about five years ago, which was an injection that caused significant nausea and vomiting. He is not currently on any weight loss medications.  He has a history of moderate sleep apnea and started using a CPAP machine 2-3 weeks ago. He is still adjusting to its use but has managed to use it through a few nights. He experiences anxiety and sleep disturbances, feeling startled upon falling asleep, which he associates with both anxiety and sleep apnea.  He is prediabetic with a recent A1c of 6.1. He is taking a combination of valsartan, amlodipine, and hydrochlorothiazide for high blood pressure. He is aware of the need to maintain adequate hydration due to the diuretic effect of hydrochlorothiazide.  He has been diagnosed with stage 3A chronic kidney disease and is under the care of a nephrologist, Dr. Barton Dubois. He is scheduled for a follow-up appointment soon to monitor his kidney function.  In terms of family history, both his parents have been overweight at different times. Socially, he reports high stress  levels and acknowledges consuming fast food more than he should, although he is attempting to adopt a Mediterranean diet and cook more at home. He has a history of playing sports, including football in high school and college, and describes himself as having been a 'husky' child.    Some associated conditions: Hypertension, OSA, Prediabetes, and Kidney disease  Contributing factors: Family history of obesity, Disruption of circadian rhythm / sleep disordered breathing, Consumption of processed foods, Moderate to high levels of stress, and Reduced physical activity  Weight promoting medications identified: None  Current nutrition plan: Portion control / smart choices  Current level of physical activity: None  Current or previous pharmacotherapy: None  Response to medication: Never tried medications   Past medical history includes:   Past Medical History:  Diagnosis Date   Hyperlipemia    Hypertension    Obesity    Urticaria      Objective:   BP 121/70   Pulse 72   Temp 99.2 F (37.3 C)   Ht 6\' 1"  (1.854 m)   Wt (!) 454 lb (205.9 kg)   SpO2 100%   BMI 59.90 kg/m  He was weighed on the bioimpedance scale: Body mass index is 59.9 kg/m.    General:  Alert, oriented and cooperative. Patient is in no acute distress.  Respiratory: Normal respiratory effort, no problems with respiration noted   Gait: able to ambulate independently  Mental Status: Normal mood and affect. Normal behavior. Normal judgment and thought content.   DIAGNOSTIC DATA REVIEWED:  BMET    Component Value Date/Time   NA  141 12/16/2016 1023   K 4.0 12/16/2016 1023   CL 103 12/16/2016 1023   CO2 26 12/16/2016 1023   GLUCOSE 99 12/16/2016 1023   GLUCOSE 110 (H) 04/21/2015 0708   BUN 15 12/16/2016 1023   CREATININE 1.22 12/16/2016 1023   CALCIUM 9.5 12/16/2016 1023   GFRNONAA 70 12/16/2016 1023   GFRAA 81 12/16/2016 1023   No results found for: "HGBA1C" No results found for: "INSULIN" CBC     Component Value Date/Time   WBC 13.6 (H) 12/16/2016 1023   WBC 7.4 05/24/2015 0625   RBC 4.93 12/16/2016 1023   RBC 5.15 05/24/2015 0625   HGB 14.0 12/16/2016 1023   HCT 41.8 12/16/2016 1023   PLT 239 12/16/2016 1023   MCV 85 12/16/2016 1023   MCH 28.4 12/16/2016 1023   MCH 27.4 05/24/2015 0625   MCHC 33.5 12/16/2016 1023   MCHC 32.1 05/24/2015 0625   RDW 15.5 (H) 12/16/2016 1023   Iron/TIBC/Ferritin/ %Sat No results found for: "IRON", "TIBC", "FERRITIN", "IRONPCTSAT" Lipid Panel  No results found for: "CHOL", "TRIG", "HDL", "CHOLHDL", "VLDL", "LDLCALC", "LDLDIRECT" Hepatic Function Panel     Component Value Date/Time   PROT 6.2 12/16/2016 1023   ALBUMIN 4.1 12/16/2016 1023   AST 12 12/16/2016 1023   ALT 16 12/16/2016 1023   ALKPHOS 43 12/16/2016 1023   BILITOT 0.6 12/16/2016 1023      Component Value Date/Time   TSH 0.433 (L) 12/16/2016 1023     Assessment and Plan:   Primary hypertension  OSA (obstructive sleep apnea)  Decreased GFR  Prediabetes         Obesity Treatment / Action Plan:  Patient will work on garnering support from family and friends to begin weight loss journey. Will work on eliminating or reducing the presence of highly palatable, calorie dense foods in the home. Will complete provided nutritional and psychosocial assessment questionnaire before the next appointment. Will be scheduled for indirect calorimetry to determine resting energy expenditure in a fasting state.  This will allow [place] to create a reduced calorie, high-protein meal plan to promote loss of fat mass while preserving muscle mass. Counseled on the health benefits of losing 5%-15% of total body weight. Was counseled on nutritional approaches to weight loss and benefits of reducing processed foods and consuming plant-based foods and high quality protein as part of nutritional weight management. Was counseled on pharmacotherapy and role as an adjunct in weight management.    Obesity Education Performed Today:  He was weighed on the bioimpedance scale and results were discussed and documented in the synopsis.  We discussed obesity as a disease and the importance of a more detailed evaluation of all the factors contributing to the disease.  We discussed the importance of long term lifestyle changes which include nutrition, exercise and behavioral modifications as well as the importance of customizing this to his specific health and social needs.  We discussed the benefits of reaching a healthier weight to alleviate the symptoms of existing conditions and reduce the risks of the biomechanical, metabolic and psychological effects of obesity.  Gerald Colon appears to be in the action stage of change and states they are ready to start intensive lifestyle modifications and behavioral modifications.  I have spent 30 minutes in the care of the patient today including: preparing to see patient (e.g. review and interpretation of tests, old notes ), obtaining and/or reviewing separately obtained history, performing a medically appropriate examination or evaluation, counseling and educating the patient,  documenting clinical information in the electronic or other health care record, and independently interpreting results and communicating results to the patient, family, or caregiver   Reviewed by clinician on day of visit: allergies, medications, problem list, medical history, surgical history, family history, social history, and previous encounter notes pertinent to obesity diagnosis.   Worthy Rancher, MD

## 2023-04-28 NOTE — Assessment & Plan Note (Signed)
 I reviewed most recent sleep study.  Moderate sleep apnea with significant oxygen desaturation. He recently started using a CPAP machine. Weight loss is expected to improve symptoms and potentially reduce CPAP dependence. - Continue using CPAP machine - Consider weight loss to improve symptoms

## 2023-04-28 NOTE — Assessment & Plan Note (Signed)
 Blood pressure is well-controlled.  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 Hydration is crucial to prevent further kidney damage, especially while on hydrochlorothiazide. Regular nephrology follow-up is essential. - Ensure adequate hydration - Follow up with nephrologist Dr. Barton Dubois

## 2023-04-28 NOTE — Assessment & Plan Note (Signed)
 Hypertension managed with valsartan, amlodipine, and hydrochlorothiazide. Blood pressure should be maintained below 130/80 mmHg, ideally closer to 120/80 mmHg. Hydration is important to prevent kidney stress due to hydrochlorothiazide. Weight loss can contribute to better blood pressure control. - Maintain blood pressure below 130/80 mmHg - Ensure adequate hydration while on hydrochlorothiazide

## 2023-04-28 NOTE — Assessment & Plan Note (Signed)
 Gerald Colon presents for medical weight management with a body fat percentage of 50% and a visceral fat rating of 43. His obesity contributes to prediabetes, hypertension, and sleep apnea. The goal is a 10-15% weight reduction to improve these conditions. The program includes healthy eating, physical activity, behavioral changes, and possibly FDA-approved medications like Zepbound, though insurance coverage may be a barrier. Long-term management is essential due to the chronic nature of obesity and potential for weight regain.  We reviewed anthropometrics, biometrics, associated medical conditions and contributing factors with patient. he would benefit from a medically tailored reduced calorie nutrional plan based on his REE (resting energy expenditure), which will be determined by indirect calorimetry.  We will also assess for cardiometabolic risk and nutritional derangements via fasting labs at intake appointment.   - Complete a detailed health questionnaire - Fast for 8 hours before the next appointment - Arrive an hour early for blood work and EKG - Develop a personalized nutrition plan - Discuss potential use of weight loss medications like Zepbound - Emphasize lifestyle changes alongside medication

## 2023-04-28 NOTE — Assessment & Plan Note (Signed)
 Most recent A1c is 6.1 in care everywhere end of last year  Patient aware of disease state and risk of progression. This may contribute to abnormal cravings, fatigue and diabetic complications without having diabetes.   We have discussed treatment options which include: losing 7 to 10% of body weight, increasing physical activity to a goal of 150 minutes a week at moderate intensity.  Advised to maintain a diet low on simple and processed carbohydrates.  He may also be a candidate for pharmacoprophylaxis with metformin or incretin mimetic.

## 2023-05-03 DIAGNOSIS — I129 Hypertensive chronic kidney disease with stage 1 through stage 4 chronic kidney disease, or unspecified chronic kidney disease: Secondary | ICD-10-CM | POA: Diagnosis not present

## 2023-05-03 DIAGNOSIS — E669 Obesity, unspecified: Secondary | ICD-10-CM | POA: Diagnosis not present

## 2023-05-03 DIAGNOSIS — N1832 Chronic kidney disease, stage 3b: Secondary | ICD-10-CM | POA: Diagnosis not present

## 2023-05-03 DIAGNOSIS — L509 Urticaria, unspecified: Secondary | ICD-10-CM | POA: Diagnosis not present

## 2023-05-15 DIAGNOSIS — G4733 Obstructive sleep apnea (adult) (pediatric): Secondary | ICD-10-CM | POA: Diagnosis not present

## 2023-06-01 ENCOUNTER — Ambulatory Visit: Payer: BC Managed Care – PPO | Admitting: Adult Health

## 2023-06-01 ENCOUNTER — Encounter (INDEPENDENT_AMBULATORY_CARE_PROVIDER_SITE_OTHER): Payer: Self-pay | Admitting: Family Medicine

## 2023-06-01 ENCOUNTER — Ambulatory Visit (INDEPENDENT_AMBULATORY_CARE_PROVIDER_SITE_OTHER): Admitting: Family Medicine

## 2023-06-01 VITALS — BP 128/85 | HR 83 | Temp 98.5°F | Ht 73.0 in | Wt >= 6400 oz

## 2023-06-01 DIAGNOSIS — R0609 Other forms of dyspnea: Secondary | ICD-10-CM | POA: Insufficient documentation

## 2023-06-01 DIAGNOSIS — E669 Obesity, unspecified: Secondary | ICD-10-CM | POA: Insufficient documentation

## 2023-06-01 DIAGNOSIS — Z6841 Body Mass Index (BMI) 40.0 and over, adult: Secondary | ICD-10-CM

## 2023-06-01 DIAGNOSIS — R5383 Other fatigue: Secondary | ICD-10-CM | POA: Insufficient documentation

## 2023-06-01 DIAGNOSIS — G4733 Obstructive sleep apnea (adult) (pediatric): Secondary | ICD-10-CM | POA: Diagnosis not present

## 2023-06-01 DIAGNOSIS — R7303 Prediabetes: Secondary | ICD-10-CM

## 2023-06-01 DIAGNOSIS — I10 Essential (primary) hypertension: Secondary | ICD-10-CM | POA: Diagnosis not present

## 2023-06-01 DIAGNOSIS — Z1331 Encounter for screening for depression: Secondary | ICD-10-CM

## 2023-06-01 DIAGNOSIS — R0602 Shortness of breath: Secondary | ICD-10-CM | POA: Diagnosis not present

## 2023-06-01 DIAGNOSIS — N1831 Chronic kidney disease, stage 3a: Secondary | ICD-10-CM | POA: Diagnosis not present

## 2023-06-01 NOTE — Progress Notes (Signed)
 Office: [phone]  /  Fax: [phone]  WEIGHT SUMMARY AND BIOMETRICS  Anthropometric Measurements Height: 6\' 1"  (1.854 m) Weight: (!) 454 lb (205.9 kg) BMI (Calculated): 59.91 Starting Weight: 454 lb Peak Weight: 455 lb Waist Measurement : 64 inches   Body Composition  Body Fat %: 48.4 % Fat Mass (lbs): 220 lbs Muscle Mass (lbs): 223.2 lbs Total Body Water (lbs): 188.4 lbs Visceral Fat Rating : 41   Other Clinical Data RMR: [id number] Fasting: yes Labs: yes Today's Visit #: 1 Starting Date: 06/01/23    Chief Complaint: OBESITY   Discussed the use of AI scribe software for clinical note transcription with the patient, who gave verbal consent to proceed.  History of Present Illness Gerald Colon is a 53 year old male with obstructive sleep apnea who presents for management of his condition and weight loss.  He was diagnosed with obstructive sleep apnea in December and has been using a CPAP machine. He struggles to keep it on for more than two hours per night, feeling like there is never enough air. He attributes this to psychological factors, especially after his grandmother's recent intubation. His wife sometimes reminds him to put the CPAP back on after he wakes up to use the bathroom. He acknowledges snoring, which worsens with weight gain.  He has a history of obesity and has been gaining weight over the last twenty years, which he attributes to a lack of exercise and poor eating habits. He has tried a keto diet in the past but stopped due to worsening cholesterol levels. He typically eats outside the home and does grocery shopping once a month. He craves salty foods, especially in the morning, and often skips breakfast. He drinks sweet tea with sugar but avoids regular sodas. He tends to eat quickly and while distracted, such as in front of the TV. Emotional eating is noted, particularly when stressed or seeking a reward.  He has hypertension, with a current blood  pressure reading of 128/85 mmHg. He previously experienced elevated blood pressure while on a keto diet due to high meat consumption.  He reports fatigue and shortness of breath with exercise. He averages two to four thousand steps per day but is not currently engaged in formal exercise. 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  He has a history of prediabetes and chronic kidney disease. He takes a men's one-a-day multivitamin.  He sleeps five to six hours per night and often wakes up still feeling tired. He has a positive Epworth score of 14, indicating excessive daytime sleepiness.  His basal metabolic rate is 2956, and his resting energy expenditure is 2894, which is lower than estimated.      PHYSICAL EXAM:  Blood pressure 128/85, pulse 83, temperature 98.5 F (36.9 C), height 6\' 1"  (1.854 m), weight (!) 454 lb (205.9 kg), SpO2 98%. Body mass index is 59.9 kg/m.  DIAGNOSTIC DATA REVIEWED:  BMET    Component Value Date/Time   NA 141 12/16/2016 1023   K 4.0 12/16/2016 1023   CL 103 12/16/2016 1023   CO2 26 12/16/2016 1023   GLUCOSE 99 12/16/2016 1023   GLUCOSE 110 (H) 04/21/2015 0708   BUN 15 12/16/2016 1023   CREATININE 1.22 12/16/2016 1023   CALCIUM 9.5 12/16/2016 1023   GFRNONAA 70 12/16/2016 1023   GFRAA 81 12/16/2016 1023   No results found for: "HGBA1C" No results found for: "INSULIN" Lab Results  Component Value Date  TSH 0.433 (L) 12/16/2016   CBC    Component Value Date/Time   WBC 13.6 (H) 12/16/2016 1023   WBC 7.4 05/24/2015 0625   RBC 4.93 12/16/2016 1023   RBC 5.15 05/24/2015 0625   HGB 14.0 12/16/2016 1023   HCT 41.8 12/16/2016 1023   PLT 239 12/16/2016 1023   MCV 85 12/16/2016 1023   MCH 28.4 12/16/2016 1023   MCH 27.4 05/24/2015 0625   MCHC 33.5 12/16/2016 1023   MCHC 32.1 05/24/2015 0625   RDW 15.5 (H) 12/16/2016 1023   Iron Studies No results found for: "IRON", "TIBC", "FERRITIN",  "IRONPCTSAT" Lipid Panel  No results found for: "CHOL", "TRIG", "HDL", "CHOLHDL", "VLDL", "LDLCALC", "LDLDIRECT" Hepatic Function Panel     Component Value Date/Time   PROT 6.2 12/16/2016 1023   ALBUMIN 4.1 12/16/2016 1023   AST 12 12/16/2016 1023   ALT 16 12/16/2016 1023   ALKPHOS 43 12/16/2016 1023   BILITOT 0.6 12/16/2016 1023      Component Value Date/Time   TSH 0.433 (L) 12/16/2016 1023   Nutritional No results found for: "VD25OH"   Assessment and Plan Assessment & Plan Obesity Obesity with weight gain over 20 years due to lack of exercise, poor eating habits, and emotional eating. Previous keto diet attempts were unsuccessful due to increased cholesterol. Current efforts are supported by family, with a structured eating plan initiated for weight management and health improvement. Emphasis on meal preparation and avoiding grab-and-go eating to ensure adherence. - Initiate structured eating plan with specific meal options and snack calories. - Provide grocery list for recommended foods. - Advise against grab-and-go eating and encourage meal preparation. - Discuss importance of consuming all recommended foods daily. - Provide food scale for accurate portion measurement. - Instruct to avoid weighing himself at home and focus on the eating plan. - Schedule follow-up in two weeks to review test results and progress.  Obstructive Sleep Apnea Obstructive sleep apnea diagnosed in December, with limited CPAP adherence (1.5 to 2 hours/night) due to psychological barriers from recent family events. Sleep apnea contributes to weight gain and increased cardiovascular risk. Emphasis on CPAP adherence for health improvement and weight management, with potential to reduce cardiovascular risks. - Encourage persistence with CPAP and gradual increase in usage time. - Suggest Breathe Right strips to improve nasal airflow during CPAP use. - Educate on relationship between sleep apnea, weight  gain, and cardiovascular health. - Reinforce importance of CPAP for weight loss and metabolic improvement. - Follow up on CPAP adherence and effectiveness in two weeks.  Hypertension Hypertension with current blood pressure at 128/85 mmHg, within acceptable range. Focus on dietary modifications to maintain and improve blood pressure control. The structured eating plan supports blood pressure management by reducing sodium intake and promoting cardiovascular health. - Monitor blood pressure regularly. - Continue with structured eating plan to support blood pressure management. - Avoid high sodium foods and opt for low sodium options.  Shortness of breath with exercise - Will work on weight loss and slowly introduce safe exercise to improve exercise intolerance and follow closely  Fatigue Likely due at least in part to obesity and obesity related comorbidities -Check labs for other causes unrelated to obesity and follow   I have personally spent 54 minutes total time today in preparation, patient care, and documentation for this visit, including the following: review of clinical lab tests; review of medical history, review pathophysiology of obesity and multiple comorbidities, discuss customized eating plan with nutritional education done today.  He was informed of the importance of frequent follow up visits to maximize his success with intensive lifestyle modifications for his multiple health conditions.    Jasmine Mesi, MD

## 2023-06-02 LAB — CMP14+EGFR
ALT: 19 IU/L (ref 0–44)
AST: 18 IU/L (ref 0–40)
Albumin: 4.1 g/dL (ref 3.8–4.9)
Alkaline Phosphatase: 55 IU/L (ref 44–121)
BUN/Creatinine Ratio: 11 (ref 9–20)
BUN: 19 mg/dL (ref 6–24)
Bilirubin Total: 0.7 mg/dL (ref 0.0–1.2)
CO2: 24 mmol/L (ref 20–29)
Calcium: 9.3 mg/dL (ref 8.7–10.2)
Chloride: 99 mmol/L (ref 96–106)
Creatinine, Ser: 1.67 mg/dL — ABNORMAL HIGH (ref 0.76–1.27)
Globulin, Total: 2.7 g/dL (ref 1.5–4.5)
Glucose: 110 mg/dL — ABNORMAL HIGH (ref 70–99)
Potassium: 4.5 mmol/L (ref 3.5–5.2)
Sodium: 139 mmol/L (ref 134–144)
Total Protein: 6.8 g/dL (ref 6.0–8.5)
eGFR: 49 mL/min/{1.73_m2} — ABNORMAL LOW (ref >59)

## 2023-06-02 LAB — IRON AND TIBC
Iron Saturation: 29 % (ref 15–55)
Iron: 76 ug/dL (ref 38–169)
Total Iron Binding Capacity: 266 ug/dL (ref 250–450)
UIBC: 190 ug/dL (ref 111–343)

## 2023-06-02 LAB — CBC WITH DIFFERENTIAL/PLATELET
Basophils Absolute: 0 10*3/uL (ref 0.0–0.2)
Basos: 0 %
EOS (ABSOLUTE): 0.4 10*3/uL (ref 0.0–0.4)
Eos: 7 %
Hematocrit: 44.2 % (ref 37.5–51.0)
Hemoglobin: 14.5 g/dL (ref 13.0–17.7)
Immature Grans (Abs): 0 10*3/uL (ref 0.0–0.1)
Immature Granulocytes: 0 %
Lymphocytes Absolute: 1.9 10*3/uL (ref 0.7–3.1)
Lymphs: 32 %
MCH: 27.8 pg (ref 26.6–33.0)
MCHC: 32.8 g/dL (ref 31.5–35.7)
MCV: 85 fL (ref 79–97)
Monocytes Absolute: 0.7 10*3/uL (ref 0.1–0.9)
Monocytes: 11 %
Neutrophils Absolute: 3 10*3/uL (ref 1.4–7.0)
Neutrophils: 50 %
Platelets: 246 10*3/uL (ref 150–450)
RBC: 5.21 x10E6/uL (ref 4.14–5.80)
RDW: 13.2 % (ref 11.6–15.4)
WBC: 6.1 10*3/uL (ref 3.4–10.8)

## 2023-06-02 LAB — HEMOGLOBIN A1C
Est. average glucose Bld gHb Est-mCnc: 140 mg/dL
Hgb A1c MFr Bld: 6.5 % — ABNORMAL HIGH (ref 4.8–5.6)

## 2023-06-02 LAB — FOLATE: Folate: 16.6 ng/mL (ref >3.0)

## 2023-06-02 LAB — VITAMIN D 25 HYDROXY (VIT D DEFICIENCY, FRACTURES): Vit D, 25-Hydroxy: 28.4 ng/mL — ABNORMAL LOW (ref 30.0–100.0)

## 2023-06-02 LAB — MAGNESIUM: Magnesium: 2 mg/dL (ref 1.6–2.3)

## 2023-06-02 LAB — FERRITIN: Ferritin: 191 ng/mL (ref 30–400)

## 2023-06-02 LAB — INSULIN, RANDOM: INSULIN: 53.7 u[IU]/mL — ABNORMAL HIGH (ref 2.6–24.9)

## 2023-06-02 LAB — TSH: TSH: 0.891 u[IU]/mL (ref 0.450–4.500)

## 2023-06-02 LAB — VITAMIN B12: Vitamin B-12: 628 pg/mL (ref 232–1245)

## 2023-06-14 DIAGNOSIS — G4733 Obstructive sleep apnea (adult) (pediatric): Secondary | ICD-10-CM | POA: Diagnosis not present

## 2023-06-15 ENCOUNTER — Encounter (INDEPENDENT_AMBULATORY_CARE_PROVIDER_SITE_OTHER): Payer: Self-pay | Admitting: Family Medicine

## 2023-06-15 ENCOUNTER — Ambulatory Visit (INDEPENDENT_AMBULATORY_CARE_PROVIDER_SITE_OTHER): Admitting: Family Medicine

## 2023-06-15 VITALS — BP 113/79 | HR 86 | Temp 98.2°F | Ht 73.0 in | Wt >= 6400 oz

## 2023-06-15 DIAGNOSIS — E119 Type 2 diabetes mellitus without complications: Secondary | ICD-10-CM | POA: Diagnosis not present

## 2023-06-15 DIAGNOSIS — N1831 Chronic kidney disease, stage 3a: Secondary | ICD-10-CM | POA: Diagnosis not present

## 2023-06-15 DIAGNOSIS — E669 Obesity, unspecified: Secondary | ICD-10-CM

## 2023-06-15 DIAGNOSIS — E559 Vitamin D deficiency, unspecified: Secondary | ICD-10-CM

## 2023-06-15 DIAGNOSIS — E1169 Type 2 diabetes mellitus with other specified complication: Secondary | ICD-10-CM

## 2023-06-15 DIAGNOSIS — Z6841 Body Mass Index (BMI) 40.0 and over, adult: Secondary | ICD-10-CM

## 2023-06-15 DIAGNOSIS — E66813 Obesity, class 3: Secondary | ICD-10-CM

## 2023-06-15 MED ORDER — VITAMIN D (ERGOCALCIFEROL) 1.25 MG (50000 UNIT) PO CAPS
50000.0000 [IU] | ORAL_CAPSULE | ORAL | 0 refills | Status: DC
Start: 2023-06-15 — End: 2023-06-29

## 2023-06-15 NOTE — Progress Notes (Signed)
 Office: [phone]  /  Fax: [phone]  WEIGHT SUMMARY AND BIOMETRICS  Anthropometric Measurements Height: 6\' 1"  (1.854 m) Weight: (!) 450 lb (204.1 kg) BMI (Calculated): 59.38 Weight at Last Visit: 454 lb Weight Lost Since Last Visit: 4 lb Weight Gained Since Last Visit: 0 Starting Weight: 454 lb Total Weight Loss (lbs): 4 lb (1.814 kg) Peak Weight: 455 lb   Body Composition  Body Fat %: 48.7 % Fat Mass (lbs): 219.2 lbs Muscle Mass (lbs): 219.8 lbs Visceral Fat Rating : 41   Other Clinical Data RMR: [id number] Fasting: no Labs: no Today's Visit #: 2 Starting Date: 06/01/23    Chief Complaint: OBESITY    History of Present Illness Gerald Colon is a 53 year old male who presents for obesity treatment and progress assessment.  He was prescribed a category four eating plan, which he has been able to follow about ten percent of the time due to challenges in organizing and prioritizing his schedule. Despite this, he has lost four pounds in the last two weeks. He is trying to increase his physical activity by doing yard work and walking for about twenty minutes two to three times per week.  He has a history of chronic kidney disease, which was exacerbated by the use of cyclosporine for hives. The cyclosporine was prescribed after an insurance change made Xolair  unaffordable. His kidney issues showed slight improvement after discontinuing cyclosporine. He is currently under the care of a kidney specialist and is due for a follow-up in six months. His GFR is currently at 49.  He has been newly diagnosed with diabetes. His fasting glucose was 110, and his A1c was elevated. He has a history of prediabetes and experiences excessive hunger, which may be related to high insulin  levels. He is not currently on any diabetes medication due to concerns about kidney function.  He has a vitamin D  deficiency, with a level of 28. He takes a men's one-a-day multivitamin, which includes  vitamin D , but it has not been sufficient to maintain adequate levels. He has darker skin, which may contribute to lower vitamin D  synthesis from sunlight.  He has a family history of diabetes and is motivated to manage his health to avoid complications he has observed in family members. He is self-employed and acknowledges the need to improve his organizational skills to better manage his health regimen. No current allergy symptoms, but increased sensitivity to allergies with age. No current respiratory issues.      PHYSICAL EXAM:  Blood pressure 113/79, pulse 86, temperature 98.2 F (36.8 C), height 6\' 1"  (1.854 m), weight (!) 450 lb (204.1 kg), SpO2 95%. Body mass index is 59.37 kg/m.  DIAGNOSTIC DATA REVIEWED:  BMET    Component Value Date/Time   NA 139 06/01/2023 1008   K 4.5 06/01/2023 1008   CL 99 06/01/2023 1008   CO2 24 06/01/2023 1008   GLUCOSE 110 (H) 06/01/2023 1008   GLUCOSE 110 (H) 04/21/2015 0708   BUN 19 06/01/2023 1008   CREATININE 1.67 (H) 06/01/2023 1008   CALCIUM 9.3 06/01/2023 1008   GFRNONAA 70 12/16/2016 1023   GFRAA 81 12/16/2016 1023   Lab Results  Component Value Date   HGBA1C 6.5 (H) 06/01/2023   Lab Results  Component Value Date   INSULIN  53.7 (H) 06/01/2023   Lab Results  Component Value Date   TSH 0.891 06/01/2023   CBC    Component Value Date/Time   WBC 6.1 06/01/2023 1008   WBC  7.4 05/24/2015 0625   RBC 5.21 06/01/2023 1008   RBC 5.15 05/24/2015 0625   HGB 14.5 06/01/2023 1008   HCT 44.2 06/01/2023 1008   PLT 246 06/01/2023 1008   MCV 85 06/01/2023 1008   MCH 27.8 06/01/2023 1008   MCH 27.4 05/24/2015 0625   MCHC 32.8 06/01/2023 1008   MCHC 32.1 05/24/2015 0625   RDW 13.2 06/01/2023 1008   Iron Studies    Component Value Date/Time   IRON 76 06/01/2023 1008   TIBC 266 06/01/2023 1008   FERRITIN 191 06/01/2023 1008   IRONPCTSAT 29 06/01/2023 1008   Lipid Panel  No results found for: "CHOL", "TRIG", "HDL", "CHOLHDL",  "VLDL", "LDLCALC", "LDLDIRECT" Hepatic Function Panel     Component Value Date/Time   PROT 6.8 06/01/2023 1008   ALBUMIN 4.1 06/01/2023 1008   AST 18 06/01/2023 1008   ALT 19 06/01/2023 1008   ALKPHOS 55 06/01/2023 1008   BILITOT 0.7 06/01/2023 1008      Component Value Date/Time   TSH 0.891 06/01/2023 1008   Nutritional Lab Results  Component Value Date   VD25OH 28.4 (L) 06/01/2023     Assessment and Plan Assessment & Plan Diabetes mellitus, type 2 New diagnosis of type 2 diabetes mellitus with A1c at 6.5 and fasting glucose at 110, indicating diabetes. Fasting insulin  is 53, showing significant insulin  resistance. Pancreas is overproducing insulin , causing weight gain and excessive hunger. Emphasized controlling blood glucose to prevent complications and the role of nutrition, particularly reducing carbohydrate intake. Discussed potential medication options for weight loss and diabetes management, pending dietary changes. Explained that diabetes diagnosis warrants higher level of care for related health issues. - Continue current Cat 4 eating plan with emphasis on reducing simple carbohydrate intake. - Re-evaluate in two weeks to assess dietary adherence and consider medication options for diabetes and weight loss.  Obesity Obesity with recent weight loss of 4 pounds in two weeks. Adhering to eating plan only 10% of the time. Engaging in physical activity such as yard work and walking 20 minutes 2-3 times per week. Emphasized weight loss to reduce kidney burden and improve overall health, highlighting nutrition and physical activity's role in weight management. - Continue current eating plan with emphasis on adherence. - Encourage regular physical activity, including walking and yard work.  Chronic kidney disease, stage 3 Chronic kidney disease stage 3 with GFR at 52. BUN is 19, indicating hydration status. Discussed importance of maintaining kidney function and preventing  further decline, emphasizing hydration's role in kidney health. Noted that GFR below 45 indicates a more serious condition. - Increase water intake to maintain hydration and support kidney function. - Monitor kidney function regularly.  Vitamin D  deficiency Vitamin D  deficiency with level at 28. Discussed skin pigmentation's role in vitamin D  synthesis and importance of adequate levels for bone health. Current multivitamin insufficient. Explained low vitamin D  can lead to calcium depletion from bones. - Prescribe prescription strength vitamin D  to be taken once weekly for three months. - Continue current multivitamin supplementation.     I have personally spent 50 minutes total time today in preparation, patient care, and documentation for this visit, including the following: review of clinical lab tests with [facility],review of medical history, review of pathophysiology of DM and how nutrition affects his insulin  resistance and how this affects his weight. Nutritional counseling done today.   He was informed of the importance of frequent follow up visits to maximize his success with intensive lifestyle modifications for his  multiple health conditions.    Jasmine Mesi, MD

## 2023-06-29 ENCOUNTER — Encounter (INDEPENDENT_AMBULATORY_CARE_PROVIDER_SITE_OTHER): Payer: Self-pay | Admitting: Family Medicine

## 2023-06-29 ENCOUNTER — Ambulatory Visit (INDEPENDENT_AMBULATORY_CARE_PROVIDER_SITE_OTHER): Admitting: Family Medicine

## 2023-06-29 VITALS — BP 116/65 | HR 92 | Temp 98.1°F | Ht 73.0 in | Wt >= 6400 oz

## 2023-06-29 DIAGNOSIS — Z6841 Body Mass Index (BMI) 40.0 and over, adult: Secondary | ICD-10-CM

## 2023-06-29 DIAGNOSIS — E119 Type 2 diabetes mellitus without complications: Secondary | ICD-10-CM

## 2023-06-29 DIAGNOSIS — E669 Obesity, unspecified: Secondary | ICD-10-CM

## 2023-06-29 DIAGNOSIS — E1159 Type 2 diabetes mellitus with other circulatory complications: Secondary | ICD-10-CM

## 2023-06-29 DIAGNOSIS — E1169 Type 2 diabetes mellitus with other specified complication: Secondary | ICD-10-CM

## 2023-06-29 DIAGNOSIS — I10 Essential (primary) hypertension: Secondary | ICD-10-CM | POA: Diagnosis not present

## 2023-06-29 DIAGNOSIS — E559 Vitamin D deficiency, unspecified: Secondary | ICD-10-CM

## 2023-06-29 MED ORDER — VITAMIN D (ERGOCALCIFEROL) 1.25 MG (50000 UNIT) PO CAPS: 50000.0000 [IU] | ORAL_CAPSULE | ORAL | 0 refills | Status: DC

## 2023-06-29 NOTE — Progress Notes (Signed)
 Office: [phone]  /  Fax: [phone]  WEIGHT SUMMARY AND BIOMETRICS  Anthropometric Measurements Height: 6\' 1"  (1.854 m) Weight: (!) 443 lb (200.9 kg) BMI (Calculated): 58.46 Weight at Last Visit: 450 lb Weight Lost Since Last Visit: 7 lb Weight Gained Since Last Visit: 0 Starting Weight: 454 lb Total Weight Loss (lbs): 11 lb (4.99 kg) Peak Weight: 455 lb   Body Composition  Body Fat %: 48.1 % Fat Mass (lbs): 213.2 lbs Muscle Mass (lbs): 218.8 lbs Total Body Water (lbs): 181.8 lbs Visceral Fat Rating : 40   Other Clinical Data RMR: [id number] Fasting: no Labs: no Today's Visit #: 3 Starting Date: 06/01/23    Chief Complaint: OBESITY   History of Present Illness Gerald Colon is a 53 year old male with obesity who presents for a follow-up on his obesity treatment plan and review of testing results.  He is adhering to a category four eating plan as part of his obesity treatment and has incorporated more physical activity by doing yard work for about two hours twice a week. He has lost seven pounds in the last two weeks, contributing to a total weight loss of eleven pounds from his initial weight of 454 pounds to 443 pounds.  He describes challenges with the eating plan, particularly with certain foods like cottage cheese and broccoli, which he found difficult to consume consistently. He experimented with making 'cottage cheese chips' but found them unsatisfactory. He has reduced milk consumption due to lactose intolerance, opting for lactose-free options like Fairlife milk. He finds it challenging to keep stocked with all necessary items for variety in his diet.  He experiences dizziness upon standing, described as the room spinning for less than a minute, but has not had any headaches. He notes a decrease in swelling in his legs, with one leg returning to normal size and the other still slightly swollen but improved.  He is currently taking vitamin D  supplements and  has been measuring his protein intake, initially consuming eight ounces and later increasing to ten ounces as per dietary guidelines. He consumes [place] yogurt and Fairlife milk as part of his protein sources.  He has a history of hypertension and diabetes, with a recent A1c of 6.5. He is not on medication for diabetes but is managing it through dietary changes.      PHYSICAL EXAM:  Blood pressure 116/65, pulse 92, temperature 98.1 F (36.7 C), height 6\' 1"  (1.854 m), weight (!) 443 lb (200.9 kg), SpO2 93%. Body mass index is 58.45 kg/m.  DIAGNOSTIC DATA REVIEWED:  BMET    Component Value Date/Time   NA 139 06/01/2023 1008   K 4.5 06/01/2023 1008   CL 99 06/01/2023 1008   CO2 24 06/01/2023 1008   GLUCOSE 110 (H) 06/01/2023 1008   GLUCOSE 110 (H) 04/21/2015 0708   BUN 19 06/01/2023 1008   CREATININE 1.67 (H) 06/01/2023 1008   CALCIUM 9.3 06/01/2023 1008   GFRNONAA 70 12/16/2016 1023   GFRAA 81 12/16/2016 1023   Lab Results  Component Value Date   HGBA1C 6.5 (H) 06/01/2023   Lab Results  Component Value Date   INSULIN  53.7 (H) 06/01/2023   Lab Results  Component Value Date   TSH 0.891 06/01/2023   CBC    Component Value Date/Time   WBC 6.1 06/01/2023 1008   WBC 7.4 05/24/2015 0625   RBC 5.21 06/01/2023 1008   RBC 5.15 05/24/2015 0625   HGB 14.5 06/01/2023 1008   HCT  44.2 06/01/2023 1008   PLT 246 06/01/2023 1008   MCV 85 06/01/2023 1008   MCH 27.8 06/01/2023 1008   MCH 27.4 05/24/2015 0625   MCHC 32.8 06/01/2023 1008   MCHC 32.1 05/24/2015 0625   RDW 13.2 06/01/2023 1008   Iron Studies    Component Value Date/Time   IRON 76 06/01/2023 1008   TIBC 266 06/01/2023 1008   FERRITIN 191 06/01/2023 1008   IRONPCTSAT 29 06/01/2023 1008   Lipid Panel  No results found for: "CHOL", "TRIG", "HDL", "CHOLHDL", "VLDL", "LDLCALC", "LDLDIRECT" Hepatic Function Panel     Component Value Date/Time   PROT 6.8 06/01/2023 1008   ALBUMIN 4.1 06/01/2023 1008   AST 18  06/01/2023 1008   ALT 19 06/01/2023 1008   ALKPHOS 55 06/01/2023 1008   BILITOT 0.7 06/01/2023 1008      Component Value Date/Time   TSH 0.891 06/01/2023 1008   Nutritional Lab Results  Component Value Date   VD25OH 28.4 (L) 06/01/2023     Assessment and Plan Assessment & Plan Type 2 diabetes mellitus Recently diagnosed with an A1c of 6.5. He is managing diabetes with the category four eating plan, designed to reduce insulin  demand on the pancreas, aiding in weight loss and improving metabolic parameters. - Continue category four eating plan. - Continue exercise as is and will slowly increase as tolerated.  Obesity Following a category four eating plan, he has lost 11 pounds since the last visit. The plan is low in simple carbohydrates and rich in fruits, vegetables, and lean proteins. He reports challenges with certain foods but remains committed. Muscle mass has started to drift, indicating a need for careful protein intake. He engages in yard work for exercise, which is beneficial. - Continue category four eating plan with additional breakfast options including [place] sausage, bacon, and cereal with Fairlife milk. - Ensure adequate protein intake to prevent muscle mass loss. - Encourage hydration to prevent dizziness related to dehydration. - Continue current level of physical activity with yard work.  Hypertension Blood pressure is well-controlled at 116/65 mmHg. He experienced dizziness, likely due to dehydration rather than hypotension. - Monitor blood pressure and adjust medications if necessary. -Continue working on diet/exercise to treat  Vitamin D  deficiency He is on vitamin D  supplementation. -Refill Vit D     He was informed of the importance of frequent follow up visits to maximize his success with intensive lifestyle modifications for his multiple health conditions.    Jasmine Mesi, MD

## 2023-07-01 NOTE — Addendum Note (Signed)
 Addended by: Saralyn Cuna A on: 07/01/2023 07:25 AM   Modules accepted: Orders

## 2023-07-19 ENCOUNTER — Encounter (INDEPENDENT_AMBULATORY_CARE_PROVIDER_SITE_OTHER): Payer: Self-pay | Admitting: Family Medicine

## 2023-07-19 ENCOUNTER — Ambulatory Visit (INDEPENDENT_AMBULATORY_CARE_PROVIDER_SITE_OTHER): Admitting: Family Medicine

## 2023-07-19 VITALS — BP 110/77 | HR 86 | Temp 98.2°F | Ht 73.0 in | Wt >= 6400 oz

## 2023-07-19 DIAGNOSIS — Z6841 Body Mass Index (BMI) 40.0 and over, adult: Secondary | ICD-10-CM | POA: Diagnosis not present

## 2023-07-19 DIAGNOSIS — E119 Type 2 diabetes mellitus without complications: Secondary | ICD-10-CM | POA: Diagnosis not present

## 2023-07-19 DIAGNOSIS — E669 Obesity, unspecified: Secondary | ICD-10-CM | POA: Diagnosis not present

## 2023-07-19 DIAGNOSIS — E559 Vitamin D deficiency, unspecified: Secondary | ICD-10-CM | POA: Diagnosis not present

## 2023-07-19 DIAGNOSIS — E1169 Type 2 diabetes mellitus with other specified complication: Secondary | ICD-10-CM

## 2023-07-19 MED ORDER — VITAMIN D (ERGOCALCIFEROL) 1.25 MG (50000 UNIT) PO CAPS
50000.0000 [IU] | ORAL_CAPSULE | ORAL | 0 refills | Status: DC
Start: 2023-07-19 — End: 2023-08-02

## 2023-07-19 NOTE — Progress Notes (Signed)
 Office: [phone]  /  Fax: [phone]  WEIGHT SUMMARY AND BIOMETRICS  Anthropometric Measurements Height: 6' 1 (1.854 m) Weight: (!) 446 lb (202.3 kg) BMI (Calculated): 58.86 Weight at Last Visit: 443 lb Weight Lost Since Last Visit: 0 Weight Gained Since Last Visit: 3 lb Starting Weight: 454 lb Total Weight Loss (lbs): 8 lb (3.629 kg) Peak Weight: 455 lb   Body Composition  Body Fat %: 49.5 % Fat Mass (lbs): 221 lbs Muscle Mass (lbs): 214.6 lbs Visceral Fat Rating : 41   Other Clinical Data RMR: [id number] Fasting: no Labs: no Today's Visit #: 4 Starting Date: 06/01/23    Chief Complaint: OBESITY   Discussed the use of AI scribe software for clinical note transcription with the patient, who gave verbal consent to proceed.  History of Present Illness Gerald Colon is a 53 year old male who presents for assessment of his obesity treatment and progress.  He has been adhering to a category four eating plan approximately 75% of the time, yet has gained three pounds since his last visit. He is not engaging in any exercise. He occasionally deviates from the diet, consuming foods like macaroni and cheese, but generally maintains a diet of vegetables and lean meats. He finds it challenging to consume the recommended amount of vegetables and fiber, describing the fiber cereal as 'like chewing leather'.  He is making efforts to manage his diet by choosing healthier options when dining out, such as salmon salad and grilled chicken. He is attempting to follow the dietary plan, which includes reducing calorie intake and increasing protein consumption.  He reflects on his past dietary habits, noting a shift from processed foods to more natural options. [place] where meals were less processed. He wants to find recipes that are both healthy and enjoyable and is open to trying new meal plans that allow for some flexibility while still meeting dietary goals.       PHYSICAL EXAM:  Blood pressure 110/77, pulse 86, temperature 98.2 F (36.8 C), height 6' 1 (1.854 m), weight (!) 446 lb (202.3 kg), SpO2 95%. Body mass index is 58.84 kg/m.  DIAGNOSTIC DATA REVIEWED:  BMET    Component Value Date/Time   NA 139 06/01/2023 1008   K 4.5 06/01/2023 1008   CL 99 06/01/2023 1008   CO2 24 06/01/2023 1008   GLUCOSE 110 (H) 06/01/2023 1008   GLUCOSE 110 (H) 04/21/2015 0708   BUN 19 06/01/2023 1008   CREATININE 1.67 (H) 06/01/2023 1008   CALCIUM 9.3 06/01/2023 1008   GFRNONAA 70 12/16/2016 1023   GFRAA 81 12/16/2016 1023   Lab Results  Component Value Date   HGBA1C 6.5 (H) 06/01/2023   Lab Results  Component Value Date   INSULIN  53.7 (H) 06/01/2023   Lab Results  Component Value Date   TSH 0.891 06/01/2023   CBC    Component Value Date/Time   WBC 6.1 06/01/2023 1008   WBC 7.4 05/24/2015 0625   RBC 5.21 06/01/2023 1008   RBC 5.15 05/24/2015 0625   HGB 14.5 06/01/2023 1008   HCT 44.2 06/01/2023 1008   PLT 246 06/01/2023 1008   MCV 85 06/01/2023 1008   MCH 27.8 06/01/2023 1008   MCH 27.4 05/24/2015 0625   MCHC 32.8 06/01/2023 1008   MCHC 32.1 05/24/2015 0625   RDW 13.2 06/01/2023 1008   Iron Studies    Component Value Date/Time   IRON 76 06/01/2023 1008   TIBC  266 06/01/2023 1008   FERRITIN 191 06/01/2023 1008   IRONPCTSAT 29 06/01/2023 1008   Lipid Panel  No results found for: CHOL, TRIG, HDL, CHOLHDL, VLDL, LDLCALC, LDLDIRECT Hepatic Function Panel     Component Value Date/Time   PROT 6.8 06/01/2023 1008   ALBUMIN 4.1 06/01/2023 1008   AST 18 06/01/2023 1008   ALT 19 06/01/2023 1008   ALKPHOS 55 06/01/2023 1008   BILITOT 0.7 06/01/2023 1008      Component Value Date/Time   TSH 0.891 06/01/2023 1008   Nutritional Lab Results  Component Value Date   VD25OH 28.4 (L) 06/01/2023     Assessment and Plan Assessment & Plan Type 2 Diabetes Mellitus Managing type 2 diabetes through diet, exercise, and  weight loss. Emphasized the importance of a balanced diet with appropriate calorie and protein intake to manage insulin  response and support weight loss. Not currently on medications for diabetes. Explained that insulin  response to carbohydrates can change quickly, and maintaining a calorie deficit is crucial for weight loss. - Continue dietary management with focus on balanced calorie and protein intake. - Monitor blood glucose levels as needed.  Obesity Following the category four eating plan approximately 75% of the time but has gained three pounds since the last visit, possibly due to water retention as the body re-equilibrates after fat loss. Not currently exercising. Emphasized hydration to help eliminate excess water and discussed sodium's role in water retention. Advised to focus on protein intake and adjust meal plans to avoid feeling deprived. Discussed the balance of calories, protein, carbs, fiber, phosphorus, and magnesium in the category four plan. Explained that deviations from the plan, such as increased sodium or calories, can temporarily affect weight loss and insulin  response. - Continue category four eating plan with adjustments to dinner for more variety and adherence. - Ensure daily protein intake meets the required levels, especially during dinner. - Hydrate well to assist in eliminating excess water. - Begin incorporating exercise after further weight loss.  Vitamin D  Deficiency Has a known vitamin D  deficiency. - Refill vitamin D  prescription.  Follow-up Follow-up appointments scheduled to monitor progress. - Follow up in 2 to 3 weeks. - Attend scheduled appointments on June 30, July 23, and August 6. - Schedule an additional follow-up appointment for the end of August.      He was informed of the importance of frequent follow up visits to maximize his success with intensive lifestyle modifications for his multiple health conditions.    Jasmine Mesi, MD

## 2023-08-02 ENCOUNTER — Ambulatory Visit (INDEPENDENT_AMBULATORY_CARE_PROVIDER_SITE_OTHER): Admitting: Family Medicine

## 2023-08-02 ENCOUNTER — Encounter (INDEPENDENT_AMBULATORY_CARE_PROVIDER_SITE_OTHER): Payer: Self-pay | Admitting: Family Medicine

## 2023-08-02 VITALS — BP 109/76 | HR 85 | Temp 98.0°F | Ht 73.0 in | Wt >= 6400 oz

## 2023-08-02 DIAGNOSIS — E860 Dehydration: Secondary | ICD-10-CM | POA: Diagnosis not present

## 2023-08-02 DIAGNOSIS — I10 Essential (primary) hypertension: Secondary | ICD-10-CM

## 2023-08-02 DIAGNOSIS — Z6841 Body Mass Index (BMI) 40.0 and over, adult: Secondary | ICD-10-CM | POA: Diagnosis not present

## 2023-08-02 DIAGNOSIS — E669 Obesity, unspecified: Secondary | ICD-10-CM | POA: Diagnosis not present

## 2023-08-02 DIAGNOSIS — E559 Vitamin D deficiency, unspecified: Secondary | ICD-10-CM

## 2023-08-02 MED ORDER — VITAMIN D (ERGOCALCIFEROL) 1.25 MG (50000 UNIT) PO CAPS: 50000.0000 [IU] | ORAL_CAPSULE | ORAL | 0 refills | Status: DC

## 2023-08-02 NOTE — Progress Notes (Signed)
 Office: [phone]  /  Fax: [phone]  WEIGHT SUMMARY AND BIOMETRICS  Anthropometric Measurements Height: 6' 1 (1.854 m) Weight: (!) 442 lb (200.5 kg) BMI (Calculated): 58.33 Weight at Last Visit: 446 lb Weight Lost Since Last Visit: 4 lb Weight Gained Since Last Visit: 0 Starting Weight: 454 lb Total Weight Loss (lbs): 12 lb (5.443 kg) Peak Weight: 455 lb   Body Composition  Body Fat %: 49.4 % Fat Mass (lbs): 218.4 lbs Muscle Mass (lbs): 212.8 lbs Visceral Fat Rating : 41   Other Clinical Data Fasting: No Labs: No Today's Visit #: 5 Starting Date: 06/01/23    Chief Complaint: OBESITY   History of Present Illness Gerald Colon is a 53 year old male who presents for obesity treatment.  He is adhering to a category four eating plan approximately 80% of the time and has lost four pounds over the last two weeks despite not currently engaging in exercise. He faces challenges with grocery shopping, such as purchasing non-Greek yogurt by mistake, which has less protein than [place] yogurt. He is focusing on increasing his protein intake.  He has access to meals prepared by a chef, which include boneless, skinless chicken thighs and chicken legs with the skin removed. He is trying to avoid high-calorie foods like macaroni and cheese, which is difficult due to its high quality and taste. He has been doubling his intake of green beans to compensate.  He occasionally indulges in fast food but tries to make healthier choices, such as removing bread from chicken sandwiches and opting for burrito bowls with double meat at [employer] to increase protein intake. His protein intake has not been consistent, especially during a week with multiple distractions, including issues with home air conditioning.  He experiences lightheadedness or dizziness once or twice when standing up, which he attributes to dehydration. He is monitoring his hydration levels to address this issue.       PHYSICAL EXAM:  Blood pressure 109/76, pulse 85, temperature 98 F (36.7 C), height 6' 1 (1.854 m), weight (!) 442 lb (200.5 kg), SpO2 96%. Body mass index is 58.31 kg/m.  DIAGNOSTIC DATA REVIEWED:  BMET    Component Value Date/Time   NA 139 06/01/2023 1008   K 4.5 06/01/2023 1008   CL 99 06/01/2023 1008   CO2 24 06/01/2023 1008   GLUCOSE 110 (H) 06/01/2023 1008   GLUCOSE 110 (H) 04/21/2015 0708   BUN 19 06/01/2023 1008   CREATININE 1.67 (H) 06/01/2023 1008   CALCIUM 9.3 06/01/2023 1008   GFRNONAA 70 12/16/2016 1023   GFRAA 81 12/16/2016 1023   Lab Results  Component Value Date   HGBA1C 6.5 (H) 06/01/2023   Lab Results  Component Value Date   INSULIN  53.7 (H) 06/01/2023   Lab Results  Component Value Date   TSH 0.891 06/01/2023   CBC    Component Value Date/Time   WBC 6.1 06/01/2023 1008   WBC 7.4 05/24/2015 0625   RBC 5.21 06/01/2023 1008   RBC 5.15 05/24/2015 0625   HGB 14.5 06/01/2023 1008   HCT 44.2 06/01/2023 1008   PLT 246 06/01/2023 1008   MCV 85 06/01/2023 1008   MCH 27.8 06/01/2023 1008   MCH 27.4 05/24/2015 0625   MCHC 32.8 06/01/2023 1008   MCHC 32.1 05/24/2015 0625   RDW 13.2 06/01/2023 1008   Iron Studies    Component Value Date/Time   IRON 76 06/01/2023 1008   TIBC 266 06/01/2023 1008   FERRITIN 191  06/01/2023 1008   IRONPCTSAT 29 06/01/2023 1008   Lipid Panel  No results found for: CHOL, TRIG, HDL, CHOLHDL, VLDL, LDLCALC, LDLDIRECT Hepatic Function Panel     Component Value Date/Time   PROT 6.8 06/01/2023 1008   ALBUMIN 4.1 06/01/2023 1008   AST 18 06/01/2023 1008   ALT 19 06/01/2023 1008   ALKPHOS 55 06/01/2023 1008   BILITOT 0.7 06/01/2023 1008      Component Value Date/Time   TSH 0.891 06/01/2023 1008   Nutritional Lab Results  Component Value Date   VD25OH 28.4 (L) 06/01/2023     Assessment and Plan Assessment & Plan Obesity He follows a category four eating plan approximately 80% of the time  and has lost four pounds in the last two weeks. He is not currently exercising but is making efforts to increase protein intake and reduce high-calorie foods. He uses a list of healthier fast food options and doubles up on green beans and meat in burrito bowls to increase protein intake. Distractions have impacted his consistency. - Continue category four eating plan. - Encourage protein intake of 40 grams or more per meal. - Advise reducing high-calorie food intake. - Utilize provided list of healthier fast food options.  Hypertension and Dehydration Blood pressure is well controlled at 109/76 mmHg. He reports occasional lightheadedness or dizziness upon standing, possibly due to dehydration or over-medication as weight loss progresses. Symptoms are positional and occur when bending over or standing up. Discussed the possibility of discontinuing blood pressure medication if symptoms persist despite adequate hydration. - Encourage adequate hydration, especially in hot weather. - Monitor for persistent lightheadedness or dizziness. - Consider discontinuing blood pressure medication if symptoms persist despite hydration.   He was informed of the importance of frequent follow up visits to maximize his success with intensive lifestyle modifications for his multiple health conditions.    Louann Penton, MD

## 2023-08-25 ENCOUNTER — Encounter (INDEPENDENT_AMBULATORY_CARE_PROVIDER_SITE_OTHER): Payer: Self-pay | Admitting: Family Medicine

## 2023-08-25 ENCOUNTER — Ambulatory Visit (INDEPENDENT_AMBULATORY_CARE_PROVIDER_SITE_OTHER): Admitting: Family Medicine

## 2023-08-25 VITALS — BP 120/81 | HR 81 | Temp 98.3°F | Ht 73.0 in | Wt >= 6400 oz

## 2023-08-25 DIAGNOSIS — E1169 Type 2 diabetes mellitus with other specified complication: Secondary | ICD-10-CM

## 2023-08-25 DIAGNOSIS — E559 Vitamin D deficiency, unspecified: Secondary | ICD-10-CM | POA: Diagnosis not present

## 2023-08-25 DIAGNOSIS — Z6841 Body Mass Index (BMI) 40.0 and over, adult: Secondary | ICD-10-CM | POA: Diagnosis not present

## 2023-08-25 DIAGNOSIS — E669 Obesity, unspecified: Secondary | ICD-10-CM

## 2023-08-25 DIAGNOSIS — E119 Type 2 diabetes mellitus without complications: Secondary | ICD-10-CM | POA: Diagnosis not present

## 2023-08-25 MED ORDER — VITAMIN D (ERGOCALCIFEROL) 1.25 MG (50000 UNIT) PO CAPS: 50000.0000 [IU] | ORAL_CAPSULE | ORAL | 0 refills | Status: DC

## 2023-08-25 NOTE — Progress Notes (Signed)
 Office: [phone]  /  Fax: [phone]  WEIGHT SUMMARY AND BIOMETRICS  Anthropometric Measurements Height: 6' 1 (1.854 m) Weight: (!) 436 lb (197.8 kg) BMI (Calculated): 57.54 Weight at Last Visit: 442 lb Weight Lost Since Last Visit: 6 lb Weight Gained Since Last Visit: 0 Starting Weight: 454 lb Total Weight Loss (lbs): 18 lb (8.165 kg) Peak Weight: 455 lb   Body Composition  Body Fat %: 47.1 % Fat Mass (lbs): 205.6 lbs Muscle Mass (lbs): 219.8 lbs Total Body Water (lbs): 183.8 lbs Visceral Fat Rating : 39   Other Clinical Data RMR: [id number] Fasting: no Labs: no Today's Visit #: 6 Starting Date: 06/01/23    Chief Complaint: OBESITY    History of Present Illness Gerald Colon is a 53 year old male with obesity, type 2 diabetes, and vitamin D  deficiency who presents for obesity treatment and progress assessment.  He has been adhering to a category four eating plan 75-80% of the time and has incorporated walking for exercise, 30 minutes twice a week. He has lost six pounds over the last three weeks. He sometimes struggles to consume the recommended portions of protein and vegetables, occasionally shifting his intake to different meals. He uses yogurt, blueberries, and cashews as snacks and occasionally beef or [place] jerky to meet protein requirements.  He has a history of vitamin D  deficiency and is prescribed vitamin D  50,000 IU weekly. He requests a refill for this medication. He feels tired and sluggish.  His type 2 diabetes is primarily managed through diet, exercise, and weight loss. He is not currently on medication for diabetes. His most recent hemoglobin A1c was 6.5% in April 2025.  His hunger levels have been manageable without unusual patterns. He sometimes eats breakfast late and snacks before bed. He sometimes questions if his weight loss is noticeable or if he is imagining it. His belt size has changed slightly, indicating weight loss.       PHYSICAL EXAM:  Blood pressure 120/81, pulse 81, temperature 98.3 F (36.8 C), height 6' 1 (1.854 m), weight (!) 436 lb (197.8 kg), SpO2 97%. Body mass index is 57.52 kg/m.  DIAGNOSTIC DATA REVIEWED:  BMET    Component Value Date/Time   NA 139 06/01/2023 1008   K 4.5 06/01/2023 1008   CL 99 06/01/2023 1008   CO2 24 06/01/2023 1008   GLUCOSE 110 (H) 06/01/2023 1008   GLUCOSE 110 (H) 04/21/2015 0708   BUN 19 06/01/2023 1008   CREATININE 1.67 (H) 06/01/2023 1008   CALCIUM 9.3 06/01/2023 1008   GFRNONAA 70 12/16/2016 1023   GFRAA 81 12/16/2016 1023   Lab Results  Component Value Date   HGBA1C 6.5 (H) 06/01/2023   Lab Results  Component Value Date   INSULIN  53.7 (H) 06/01/2023   Lab Results  Component Value Date   TSH 0.891 06/01/2023   CBC    Component Value Date/Time   WBC 6.1 06/01/2023 1008   WBC 7.4 05/24/2015 0625   RBC 5.21 06/01/2023 1008   RBC 5.15 05/24/2015 0625   HGB 14.5 06/01/2023 1008   HCT 44.2 06/01/2023 1008   PLT 246 06/01/2023 1008   MCV 85 06/01/2023 1008   MCH 27.8 06/01/2023 1008   MCH 27.4 05/24/2015 0625   MCHC 32.8 06/01/2023 1008   MCHC 32.1 05/24/2015 0625   RDW 13.2 06/01/2023 1008   Iron Studies    Component Value Date/Time   IRON 76 06/01/2023 1008   TIBC 266 06/01/2023 1008  FERRITIN 191 06/01/2023 1008   IRONPCTSAT 29 06/01/2023 1008   Lipid Panel  No results found for: CHOL, TRIG, HDL, CHOLHDL, VLDL, LDLCALC, LDLDIRECT Hepatic Function Panel     Component Value Date/Time   PROT 6.8 06/01/2023 1008   ALBUMIN 4.1 06/01/2023 1008   AST 18 06/01/2023 1008   ALT 19 06/01/2023 1008   ALKPHOS 55 06/01/2023 1008   BILITOT 0.7 06/01/2023 1008      Component Value Date/Time   TSH 0.891 06/01/2023 1008   Nutritional Lab Results  Component Value Date   VD25OH 28.4 (L) 06/01/2023     Assessment and Plan Assessment & Plan Type 2 Diabetes Type 2 diabetes is well-managed with diet, exercise, and  weight loss. Hemoglobin A1c in April was 6.5, indicating controlled diabetes. Protein intake aids in blood sugar control by preventing insulin  spikes and reducing hunger signals. Achieving remission is possible through lifestyle changes, but genetic predisposition may lead to recurrence if not maintained. - Continue management with diet, exercise, and weight loss. - Plan fasting blood work at the next visit to reassess hemoglobin A1c.  Obesity Following a category four eating plan 75-80% of the time, resulting in a 6-pound weight loss over three weeks. Engages in walking for 30 minutes twice a week. Protein intake is emphasized to maintain muscle mass, support metabolism, and aid in satiety and blood sugar control. Protein does not cause insulin  spikes, reducing hunger signals. Prioritize protein intake, followed by vegetables, to aid in satiety and blood sugar control. - Continue category four eating plan. - Increase protein intake, prioritizing it over other food groups. - Continue walking for 30 minutes twice a week. - Consider adding 2-pound ankle weights for strengthening during walks. - Avoid exercising in temperatures above 67F.  Vitamin D  Deficiency Vitamin D  deficiency is being treated with vitamin D  50,000 IU per week. Low vitamin D  levels can lead to fatigue, sluggishness, and increased risk of osteoporosis. He requested a refill of his vitamin D  prescription. - Refill vitamin D  50,000 IU per week.  Follow-up Scheduled for a follow-up appointment in three weeks to reassess progress with weight loss, diabetes management, and vitamin D  levels. - Follow up in three weeks. - Plan fasting blood work at the next visit.    He was informed of the importance of frequent follow up visits to maximize his success with intensive lifestyle modifications for his multiple health conditions.    Louann Penton, MD

## 2023-09-08 ENCOUNTER — Encounter (INDEPENDENT_AMBULATORY_CARE_PROVIDER_SITE_OTHER): Payer: Self-pay | Admitting: Family Medicine

## 2023-09-08 ENCOUNTER — Ambulatory Visit (INDEPENDENT_AMBULATORY_CARE_PROVIDER_SITE_OTHER): Admitting: Family Medicine

## 2023-09-08 VITALS — HR 72 | Temp 97.8°F | Ht 73.0 in | Wt >= 6400 oz

## 2023-09-08 DIAGNOSIS — Z6841 Body Mass Index (BMI) 40.0 and over, adult: Secondary | ICD-10-CM

## 2023-09-08 DIAGNOSIS — G4733 Obstructive sleep apnea (adult) (pediatric): Secondary | ICD-10-CM | POA: Diagnosis not present

## 2023-09-08 DIAGNOSIS — E66813 Obesity, class 3: Secondary | ICD-10-CM

## 2023-09-08 DIAGNOSIS — E669 Obesity, unspecified: Secondary | ICD-10-CM

## 2023-09-08 DIAGNOSIS — E559 Vitamin D deficiency, unspecified: Secondary | ICD-10-CM

## 2023-09-08 DIAGNOSIS — E119 Type 2 diabetes mellitus without complications: Secondary | ICD-10-CM

## 2023-09-08 DIAGNOSIS — I878 Other specified disorders of veins: Secondary | ICD-10-CM

## 2023-09-08 DIAGNOSIS — R600 Localized edema: Secondary | ICD-10-CM | POA: Diagnosis not present

## 2023-09-08 DIAGNOSIS — E1169 Type 2 diabetes mellitus with other specified complication: Secondary | ICD-10-CM | POA: Diagnosis not present

## 2023-09-08 MED ORDER — VITAMIN D (ERGOCALCIFEROL) 1.25 MG (50000 UNIT) PO CAPS
50000.0000 [IU] | ORAL_CAPSULE | ORAL | 0 refills | Status: DC
Start: 2023-09-08 — End: 2023-09-22

## 2023-09-08 NOTE — Progress Notes (Signed)
 Office: [phone]  /  Fax: [phone]  WEIGHT SUMMARY AND BIOMETRICS  Anthropometric Measurements Height: 6' 1 (1.854 m) Weight: (!) 430 lb (195 kg) BMI (Calculated): 56.74 Weight at Last Visit: 436lb Weight Lost Since Last Visit: 6lb Weight Gained Since Last Visit: 0lb Starting Weight: 454lb Total Weight Loss (lbs): 24 lb (10.9 kg) Peak Weight: 455lb   Body Composition  Body Fat %: 47 % Fat Mass (lbs): 202.4 lbs Muscle Mass (lbs): 217.2 lbs Total Body Water (lbs): 178 lbs Visceral Fat Rating : 38   Other Clinical Data RMR: [id number] Fasting: No Labs: no Today's Visit #: 7 Starting Date: 06/01/23    Chief Complaint: OBESITY   History of Present Illness Gerald Colon is a 53 year old male with obesity, obstructive sleep apnea, and type 2 diabetes who presents for obesity treatment and assessment of progress.  He is adhering to a category four eating plan approximately eighty percent of the time and engages in physical activity three times a week, resulting in a six-pound weight loss over the past three weeks. He does not experience excessive hunger and manages it with yogurt or blueberries if needed. He is not currently on any medication for blood sugar control.  He has a history of obstructive sleep apnea and uses a CPAP machine. However, he experiences anxiety and a sensation of being 'startled out of sleep' when using it, which he associates with witnessing his grandmother's intubation before her passing. He has tried using the CPAP while sitting up and watching TV, which allows him to tolerate it for three to four hours at night, but he continues to experience anxiety and dread about its use. He snores.  He reports swelling in his ankles, which began two to three years ago and worsens throughout the day, particularly in one leg. He notes skin color changes and swelling by the end of the day, with no seasonal pattern observed.  He has a vitamin D  deficiency and  is on prescription vitamin D  at fifty thousand international units per day.      PHYSICAL EXAM:  Pulse 72, temperature 97.8 F (36.6 C), height 6' 1 (1.854 m), weight (!) 430 lb (195 kg), SpO2 99%. Body mass index is 56.73 kg/m.  DIAGNOSTIC DATA REVIEWED:  BMET    Component Value Date/Time   NA 139 06/01/2023 1008   K 4.5 06/01/2023 1008   CL 99 06/01/2023 1008   CO2 24 06/01/2023 1008   GLUCOSE 110 (H) 06/01/2023 1008   GLUCOSE 110 (H) 04/21/2015 0708   BUN 19 06/01/2023 1008   CREATININE 1.67 (H) 06/01/2023 1008   CALCIUM 9.3 06/01/2023 1008   GFRNONAA 70 12/16/2016 1023   GFRAA 81 12/16/2016 1023   Lab Results  Component Value Date   HGBA1C 6.5 (H) 06/01/2023   Lab Results  Component Value Date   INSULIN  53.7 (H) 06/01/2023   Lab Results  Component Value Date   TSH 0.891 06/01/2023   CBC    Component Value Date/Time   WBC 6.1 06/01/2023 1008   WBC 7.4 05/24/2015 0625   RBC 5.21 06/01/2023 1008   RBC 5.15 05/24/2015 0625   HGB 14.5 06/01/2023 1008   HCT 44.2 06/01/2023 1008   PLT 246 06/01/2023 1008   MCV 85 06/01/2023 1008   MCH 27.8 06/01/2023 1008   MCH 27.4 05/24/2015 0625   MCHC 32.8 06/01/2023 1008   MCHC 32.1 05/24/2015 0625   RDW 13.2 06/01/2023 1008   Iron Studies  Component Value Date/Time   IRON 76 06/01/2023 1008   TIBC 266 06/01/2023 1008   FERRITIN 191 06/01/2023 1008   IRONPCTSAT 29 06/01/2023 1008   Lipid Panel  No results found for: CHOL, TRIG, HDL, CHOLHDL, VLDL, LDLCALC, LDLDIRECT Hepatic Function Panel     Component Value Date/Time   PROT 6.8 06/01/2023 1008   ALBUMIN 4.1 06/01/2023 1008   AST 18 06/01/2023 1008   ALT 19 06/01/2023 1008   ALKPHOS 55 06/01/2023 1008   BILITOT 0.7 06/01/2023 1008      Component Value Date/Time   TSH 0.891 06/01/2023 1008   Nutritional Lab Results  Component Value Date   VD25OH 28.4 (L) 06/01/2023     Assessment and Plan Assessment & Plan Obesity Following  the category four eating plan 80% of the time and exercising three times a week. Lost six pounds in the last three weeks. Hunger levels are appropriate. Weight loss is progressing, but sleep apnea may be affecting metabolism and weight loss rate. - Continue category four eating plan - Continue exercising three times a week  Obstructive sleep apnea Currently not using CPAP due to anxiety and discomfort. Experiences a startle feeling when using CPAP, associated with anxiety from a past traumatic experience. Weight loss may improve sleep apnea, but current management is necessary to optimize metabolism and cardiovascular health. Sleep apnea affects metabolism by reducing oxygen levels, which can decrease calorie burning and impact weight loss. - Contact CPAP provider to discuss mask options and acclimation strategies - Consider trying a full face mask if nasal pillows are not tolerated - Gradually acclimate to CPAP use by wearing it during engaging activities - Consider sleeping in a reclined position to improve comfort with CPAP  Type 2 diabetes mellitus Currently not on medication for blood sugar control. Management is focused on lifestyle modifications, including diet and exercise, to control blood sugar levels. - Continue diet, exercise and weight loss as discussed today as an important part of the treatment plan - Check labs  Vitamin D  deficiency On prescription vitamin D  50,000 IU per day but not yet at goal. - Refill vitamin D  prescription - Continue monitoring vitamin D  levels - Check labs  Lower extremity edema with venous stasis changes Reports swelling in the ankles, worse by the end of the day, with the right leg being more affected. Signs of venous stasis changes. Edema likely due to venous insufficiency exacerbated by obesity and sedentary lifestyle. Muscle activity can help improve venous return and reduce edema. - Encourage regular walking to improve venous return - Advise on  leg elevation when possible - Maintain adequate hydration     I have personally spent 45 minutes total time today in preparation, patient care, and documentation for this visit, including the following: review of clinical lab tests; review of medical history, review of OSA MOA and health risks associated with DM, OSA and Obesity.   He was informed of the importance of frequent follow up visits to maximize his success with intensive lifestyle modifications for his multiple health conditions.    Louann Penton, MD

## 2023-09-09 ENCOUNTER — Encounter (INDEPENDENT_AMBULATORY_CARE_PROVIDER_SITE_OTHER): Payer: Self-pay | Admitting: Family Medicine

## 2023-09-09 LAB — LIPID PANEL WITH LDL/HDL RATIO
Cholesterol, Total: 213 mg/dL — ABNORMAL HIGH (ref 100–199)
HDL: 45 mg/dL (ref >39)
LDL Chol Calc (NIH): 153 mg/dL — ABNORMAL HIGH (ref 0–99)
LDL/HDL Ratio: 3.4 ratio (ref 0.0–3.6)
Triglycerides: 86 mg/dL (ref 0–149)
VLDL Cholesterol Cal: 15 mg/dL (ref 5–40)

## 2023-09-09 LAB — VITAMIN B12: Vitamin B-12: 803 pg/mL (ref 232–1245)

## 2023-09-09 LAB — VITAMIN D 25 HYDROXY (VIT D DEFICIENCY, FRACTURES): Vit D, 25-Hydroxy: 40 ng/mL (ref 30.0–100.0)

## 2023-09-09 LAB — CMP14+EGFR
ALT: 24 IU/L (ref 0–44)
AST: 18 IU/L (ref 0–40)
Albumin: 4.3 g/dL (ref 3.8–4.9)
Alkaline Phosphatase: 56 IU/L (ref 44–121)
BUN/Creatinine Ratio: 15 (ref 9–20)
BUN: 25 mg/dL — ABNORMAL HIGH (ref 6–24)
Bilirubin Total: 0.9 mg/dL (ref 0.0–1.2)
CO2: 23 mmol/L (ref 20–29)
Calcium: 9.6 mg/dL (ref 8.7–10.2)
Chloride: 100 mmol/L (ref 96–106)
Creatinine, Ser: 1.65 mg/dL — ABNORMAL HIGH (ref 0.76–1.27)
Globulin, Total: 2.6 g/dL (ref 1.5–4.5)
Glucose: 99 mg/dL (ref 70–99)
Potassium: 4.2 mmol/L (ref 3.5–5.2)
Sodium: 140 mmol/L (ref 134–144)
Total Protein: 6.9 g/dL (ref 6.0–8.5)
eGFR: 49 mL/min/1.73 — ABNORMAL LOW (ref >59)

## 2023-09-09 LAB — HEMOGLOBIN A1C
Est. average glucose Bld gHb Est-mCnc: 131 mg/dL
Hgb A1c MFr Bld: 6.2 % — ABNORMAL HIGH (ref 4.8–5.6)

## 2023-09-09 LAB — INSULIN, RANDOM: INSULIN: 29.6 u[IU]/mL — ABNORMAL HIGH (ref 2.6–24.9)

## 2023-09-09 NOTE — Telephone Encounter (Signed)
 Called pt to schedule a visit to go over his results, no answer, LVM.

## 2023-09-22 ENCOUNTER — Telehealth (INDEPENDENT_AMBULATORY_CARE_PROVIDER_SITE_OTHER): Admitting: Family Medicine

## 2023-09-22 ENCOUNTER — Encounter (INDEPENDENT_AMBULATORY_CARE_PROVIDER_SITE_OTHER): Payer: Self-pay | Admitting: Family Medicine

## 2023-09-22 VITALS — BP 121/80 | Ht 73.0 in | Wt >= 6400 oz

## 2023-09-22 DIAGNOSIS — E669 Obesity, unspecified: Secondary | ICD-10-CM

## 2023-09-22 DIAGNOSIS — Z6841 Body Mass Index (BMI) 40.0 and over, adult: Secondary | ICD-10-CM

## 2023-09-22 DIAGNOSIS — U071 COVID-19: Secondary | ICD-10-CM | POA: Diagnosis not present

## 2023-09-22 DIAGNOSIS — E66813 Obesity, class 3: Secondary | ICD-10-CM

## 2023-09-22 DIAGNOSIS — I10 Essential (primary) hypertension: Secondary | ICD-10-CM | POA: Diagnosis not present

## 2023-09-22 DIAGNOSIS — E559 Vitamin D deficiency, unspecified: Secondary | ICD-10-CM

## 2023-09-22 MED ORDER — VITAMIN D (ERGOCALCIFEROL) 1.25 MG (50000 UNIT) PO CAPS: 50000.0000 [IU] | ORAL_CAPSULE | ORAL | 0 refills | Status: DC

## 2023-09-22 NOTE — Progress Notes (Signed)
 Office: [phone]  /  Fax: [phone]  WEIGHT SUMMARY AND BIOMETRICS  Anthropometric Measurements Height: 6' 1 (1.854 m) Weight: (!) 430 lb (195 kg) (last visit) BMI (Calculated): 56.74 Starting Weight: 454 lb Peak Weight: 455 lb   No data recorded Other Clinical Data RMR: [id number] Fasting: no Labs: no Today's Visit #: 8 Starting Date: 06/01/23 Comments: My chart video visit    Chief Complaint: OBESITY  Virtual Visit via A/V Note  I connected with Kyro A Canche on 09/22/23 at  2:40 PM EDT by audiovisual telehealth and verified that I am speaking with the correct person using two identifiers.  Location: Patient: home Provider: clinic   I discussed the limitations, risks, security and privacy concerns of performing an evaluation and management service by AV telehealth and the availability of in person appointments. I also discussed with the patient that there may be a patient responsible charge related to this service. The patient expressed understanding and agreed to proceed.     History of Present Illness   Gerald Colon is a 53 year old male who presents for follow-up on COVID-19 management and weight management.  He is following up after being diagnosed with COVID-19. His worst symptoms have been chest congestion and mucus production, which have improved significantly. He experiences coughing fits around noon, which are gradually decreasing. He has been testing positive for COVID-19 for at least ten days, with his first positive test on September 16, 2023. His symptoms began approximately a week before the positive test. He did not take Paxlovid as he was beyond the initial days of symptom onset when he tested positive.  He experiences significant fatigue, which comes and goes, and has been resting frequently. He experiences periods of feeling better followed by sudden fatigue. He has been able to sleep well and often feels tired enough to 'close my eyes at any  moment.'  He experienced a loss of taste and smell, which he first noticed when he could not smell a cough drop. This symptom was one of the indicators that led him to test for COVID-19.  He has been taking Coricidin for people with high blood pressure to manage his symptoms, which has helped with mucus expectoration and cough control. He has not used any other specific COVID-19 treatments.  Regarding his weight management, he has been following his Category 4 eating plan about 50% of the time. He has lost weight since his last visit, although he finds it difficult to compare with his home scale. His physical activity has been limited due to illness, but prior to getting sick, he was engaging in walking, work, and cardio exercises. He has been eating less overall but has tried to maintain a balanced diet, including cooking chicken thighs and consuming vegetables. He has recently resumed his meal plan after a period of ordering food due to illness.  His family, including his wife and oldest son, also contracted COVID-19, with his youngest son testing negative recently. He suspects they may have contracted the virus during a family outing for his wife's birthday.          PHYSICAL EXAM:  Blood pressure 121/80, height 6' 1 (1.854 m), weight (!) 430 lb (195 kg). Body mass index is 56.73 kg/m.  DIAGNOSTIC DATA REVIEWED:  BMET    Component Value Date/Time   NA 140 09/08/2023 1309   K 4.2 09/08/2023 1309   CL 100 09/08/2023 1309   CO2 23 09/08/2023 1309   GLUCOSE 99 09/08/2023  1309   GLUCOSE 110 (H) 04/21/2015 0708   BUN 25 (H) 09/08/2023 1309   CREATININE 1.65 (H) 09/08/2023 1309   CALCIUM 9.6 09/08/2023 1309   GFRNONAA 70 12/16/2016 1023   GFRAA 81 12/16/2016 1023   Lab Results  Component Value Date   HGBA1C 6.2 (H) 09/08/2023   HGBA1C 6.5 (H) 06/01/2023   Lab Results  Component Value Date   INSULIN  29.6 (H) 09/08/2023   INSULIN  53.7 (H) 06/01/2023   Lab Results  Component  Value Date   TSH 0.891 06/01/2023   CBC    Component Value Date/Time   WBC 6.1 06/01/2023 1008   WBC 7.4 05/24/2015 0625   RBC 5.21 06/01/2023 1008   RBC 5.15 05/24/2015 0625   HGB 14.5 06/01/2023 1008   HCT 44.2 06/01/2023 1008   PLT 246 06/01/2023 1008   MCV 85 06/01/2023 1008   MCH 27.8 06/01/2023 1008   MCH 27.4 05/24/2015 0625   MCHC 32.8 06/01/2023 1008   MCHC 32.1 05/24/2015 0625   RDW 13.2 06/01/2023 1008   Iron Studies    Component Value Date/Time   IRON 76 06/01/2023 1008   TIBC 266 06/01/2023 1008   FERRITIN 191 06/01/2023 1008   IRONPCTSAT 29 06/01/2023 1008   Lipid Panel     Component Value Date/Time   CHOL 213 (H) 09/08/2023 1309   TRIG 86 09/08/2023 1309   HDL 45 09/08/2023 1309   LDLCALC 153 (H) 09/08/2023 1309   Hepatic Function Panel     Component Value Date/Time   PROT 6.9 09/08/2023 1309   ALBUMIN 4.3 09/08/2023 1309   AST 18 09/08/2023 1309   ALT 24 09/08/2023 1309   ALKPHOS 56 09/08/2023 1309   BILITOT 0.9 09/08/2023 1309      Component Value Date/Time   TSH 0.891 06/01/2023 1008   Nutritional Lab Results  Component Value Date   VD25OH 40.0 09/08/2023   VD25OH 28.4 (L) 06/01/2023     Assessment and Plan    COVID-19 infection COVID-19 infection with symptoms persisting for approximately three weeks, including chest congestion, mucus production, coughing fits, fatigue, and intermittent anosmia. Symptoms have improved significantly but are not fully resolved. No antiviral treatment was initiated as symptoms were not recognized early enough. Current management includes Coricidin for hypertension, which has been effective in managing symptoms. Coughing can persist for up to six weeks. Discussed the use of Delsym to reduce coughing by about half. Emphasized the importance of monitoring oxygen levels with a pulse oximeter, especially if symptoms worsen. - Continue Coricidin for symptom management. - Recommend Delsym for cough  management. - Advise purchasing a pulse oximeter for home monitoring of oxygen levels. - Encourage rest and adequate sleep to aid recovery.  Hypertension Hypertension is being managed with antihypertensives and with lifestyle changes. He is on Coricidin for his COVID symptoms which should not affect his BP like other decongestants can do.   - Continue Coricidin for cold symptoms - Continue meds for hypertension management. - Continue diet, exercise and weight loss as part of his treatment plan.    Obesity Obesity management is ongoing. He has been following the Category 4 eating plan approximately 50% of the time. Recent illness has impacted adherence to the diet plan, but he has been mindful of his food choices. Weight loss noted since the last visit. Emphasized the importance of protein intake to prevent metabolic slowdown during illness recovery. Encouraged to resume the eating plan as health improves. - Encourage adherence to the  Category 4 eating plan as health allows. - Increase protein intake to prevent metabolic slowdown. - Consider protein drinks temporarily to ensure adequate protein intake.       He was informed of the importance of frequent follow up visits to maximize his success with intensive lifestyle modifications for his multiple health conditions.    Louann Penton, MD

## 2023-10-02 DIAGNOSIS — L509 Urticaria, unspecified: Secondary | ICD-10-CM | POA: Diagnosis not present

## 2023-10-06 ENCOUNTER — Ambulatory Visit (INDEPENDENT_AMBULATORY_CARE_PROVIDER_SITE_OTHER): Admitting: Family Medicine

## 2023-10-20 ENCOUNTER — Ambulatory Visit (INDEPENDENT_AMBULATORY_CARE_PROVIDER_SITE_OTHER): Admitting: Family Medicine

## 2023-11-03 ENCOUNTER — Ambulatory Visit (INDEPENDENT_AMBULATORY_CARE_PROVIDER_SITE_OTHER): Admitting: Family Medicine

## 2023-11-03 ENCOUNTER — Encounter (INDEPENDENT_AMBULATORY_CARE_PROVIDER_SITE_OTHER): Payer: Self-pay | Admitting: Family Medicine

## 2023-11-03 VITALS — BP 119/82 | HR 91 | Temp 98.3°F | Ht 73.0 in | Wt >= 6400 oz

## 2023-11-03 DIAGNOSIS — E559 Vitamin D deficiency, unspecified: Secondary | ICD-10-CM

## 2023-11-03 DIAGNOSIS — Z6841 Body Mass Index (BMI) 40.0 and over, adult: Secondary | ICD-10-CM | POA: Diagnosis not present

## 2023-11-03 DIAGNOSIS — E66813 Obesity, class 3: Secondary | ICD-10-CM

## 2023-11-03 MED ORDER — VITAMIN D (ERGOCALCIFEROL) 1.25 MG (50000 UNIT) PO CAPS
50000.0000 [IU] | ORAL_CAPSULE | ORAL | 0 refills | Status: DC
Start: 2023-11-03 — End: 2023-12-01

## 2023-11-03 NOTE — Progress Notes (Signed)
 Office: [phone]  /  Fax: [phone]  WEIGHT SUMMARY AND BIOMETRICS  Anthropometric Measurements Height: 6' 1 (1.854 m) Weight: (!) 442 lb (200.5 kg) BMI (Calculated): 58.33 Weight at Last Visit: 430 lb Weight Lost Since Last Visit: 0 Weight Gained Since Last Visit: 12 lb Starting Weight: 454 lb Total Weight Loss (lbs): 12 lb (5.443 kg) Peak Weight: 455 lb   Body Composition  Body Fat %: 49.8 % Fat Mass (lbs): 220.6 lbs Muscle Mass (lbs): 211.4 lbs Visceral Fat Rating : 41   Other Clinical Data RMR: [id number] Fasting: no Labs: no Today's Visit #: 9 Starting Date: 06/01/22    Chief Complaint: OBESITY    History of Present Illness Gerald Colon is a 53 year old male with obesity and vitamin D  deficiency who presents for obesity treatment and progress assessment.  He has been off track with his prescribed meal plan for the last two months due to illness with COVID-19, which took three to four weeks for initial recovery, followed by a prolonged cough lasting about six weeks. During this period, he did not exercise and gained twelve pounds.  He has been prescribed a category four meal plan but has not adhered strictly to it, often consuming more than the recommended portions. He uses fruits and [place] yogurt to manage sweet cravings but struggles with the presence of snacks at home, which are often brought in by his wife. He describes a challenging home environment for weight management due to the presence of snacks, which are a temptation. His wife has a preference for sweets, and he has had discussions about the impact of these snacks on his weight management efforts.  In addition to obesity, he is being treated for vitamin D  deficiency with ergocalciferol  50,000 IU weekly and requests a refill.  His entire family, including his wife and son, contracted COVID-19, with his wife experiencing prolonged coughing and him experiencing significant aching for four days. He  reports that after recovering from COVID-19, when he catches a minor cold or starts coughing, it seems to make things worse than before, though he is unsure if this is just his perception.      PHYSICAL EXAM:  Blood pressure 119/82, pulse 91, temperature 98.3 F (36.8 C), height 6' 1 (1.854 m), weight (!) 442 lb (200.5 kg), SpO2 96%. Body mass index is 58.31 kg/m.  DIAGNOSTIC DATA REVIEWED:  BMET    Component Value Date/Time   NA 140 09/08/2023 1309   K 4.2 09/08/2023 1309   CL 100 09/08/2023 1309   CO2 23 09/08/2023 1309   GLUCOSE 99 09/08/2023 1309   GLUCOSE 110 (H) 04/21/2015 0708   BUN 25 (H) 09/08/2023 1309   CREATININE 1.65 (H) 09/08/2023 1309   CALCIUM 9.6 09/08/2023 1309   GFRNONAA 70 12/16/2016 1023   GFRAA 81 12/16/2016 1023   Lab Results  Component Value Date   HGBA1C 6.2 (H) 09/08/2023   HGBA1C 6.5 (H) 06/01/2023   Lab Results  Component Value Date   INSULIN  29.6 (H) 09/08/2023   INSULIN  53.7 (H) 06/01/2023   Lab Results  Component Value Date   TSH 0.891 06/01/2023   CBC    Component Value Date/Time   WBC 6.1 06/01/2023 1008   WBC 7.4 05/24/2015 0625   RBC 5.21 06/01/2023 1008   RBC 5.15 05/24/2015 0625   HGB 14.5 06/01/2023 1008   HCT 44.2 06/01/2023 1008   PLT 246 06/01/2023 1008   MCV 85 06/01/2023 1008   MCH  27.8 06/01/2023 1008   MCH 27.4 05/24/2015 0625   MCHC 32.8 06/01/2023 1008   MCHC 32.1 05/24/2015 0625   RDW 13.2 06/01/2023 1008   Iron Studies    Component Value Date/Time   IRON 76 06/01/2023 1008   TIBC 266 06/01/2023 1008   FERRITIN 191 06/01/2023 1008   IRONPCTSAT 29 06/01/2023 1008   Lipid Panel     Component Value Date/Time   CHOL 213 (H) 09/08/2023 1309   TRIG 86 09/08/2023 1309   HDL 45 09/08/2023 1309   LDLCALC 153 (H) 09/08/2023 1309   Hepatic Function Panel     Component Value Date/Time   PROT 6.9 09/08/2023 1309   ALBUMIN 4.3 09/08/2023 1309   AST 18 09/08/2023 1309   ALT 24 09/08/2023 1309   ALKPHOS  56 09/08/2023 1309   BILITOT 0.9 09/08/2023 1309      Component Value Date/Time   TSH 0.891 06/01/2023 1008   Nutritional Lab Results  Component Value Date   VD25OH 40.0 09/08/2023   VD25OH 28.4 (L) 06/01/2023     Assessment and Plan Assessment & Plan Class 3 Obesity with elevated BMI (BMI 50.0-59.9) Class 3 obesity with a BMI between 50.0-59.9. Weight gain of 12 pounds over the last two months, attributed to a lack of adherence to the prescribed meal plan and exercise regimen due to a recent COVID-19 infection. Muscle mass has decreased, and water retention is noted. He expresses a need for a structured plan to regain control over his weight management. Discussed the impact of environmental factors, such as the presence of snacks at home, on weight management. Explored his dietary habits and deviations from the meal plan. Discussed the potential benefits and limitations of a low carbohydrate plan to jumpstart weight loss. The low carbohydrate plan is designed to reduce water and fat quickly but is not nutritionally valid for long-term use due to the absence of fruits and other essential nutrients. - Implement a low carbohydrate plan for 1-2 months with strict adherence. Provide a list of allowed foods. - Revert to category four meal plan if unable to adhere strictly to the low carbohydrate plan. - Schedule follow-up in 3-4 weeks to assess progress and adjust the plan as needed.  Vitamin D  deficiency Vitamin D  deficiency currently managed with ergocalciferol  50,000 IU weekly. He requests a refill of the prescription. - Refill prescription for ergocalciferol  50,000 IU weekly.    Gerald Colon was informed of the importance of frequent follow up visits to maximize his success with intensive lifestyle modifications for his obesity and obesity related health conditions as recommended by USPSTF and CMS guidelines   Louann Penton, MD

## 2023-11-17 ENCOUNTER — Ambulatory Visit (INDEPENDENT_AMBULATORY_CARE_PROVIDER_SITE_OTHER): Admitting: Family Medicine

## 2023-11-29 ENCOUNTER — Emergency Department (HOSPITAL_BASED_OUTPATIENT_CLINIC_OR_DEPARTMENT_OTHER)
Admission: EM | Admit: 2023-11-29 | Discharge: 2023-11-29 | Disposition: A | Discharge status: Home / Self Care | Attending: Emergency Medicine | Admitting: Emergency Medicine

## 2023-11-29 ENCOUNTER — Encounter (HOSPITAL_BASED_OUTPATIENT_CLINIC_OR_DEPARTMENT_OTHER): Payer: Self-pay | Admitting: Emergency Medicine

## 2023-11-29 ENCOUNTER — Emergency Department (HOSPITAL_BASED_OUTPATIENT_CLINIC_OR_DEPARTMENT_OTHER): Admitting: Radiology

## 2023-11-29 ENCOUNTER — Other Ambulatory Visit: Payer: Self-pay

## 2023-11-29 DIAGNOSIS — M19071 Primary osteoarthritis, right ankle and foot: Secondary | ICD-10-CM | POA: Diagnosis not present

## 2023-11-29 DIAGNOSIS — R609 Edema, unspecified: Secondary | ICD-10-CM | POA: Diagnosis not present

## 2023-11-29 DIAGNOSIS — M25571 Pain in right ankle and joints of right foot: Secondary | ICD-10-CM | POA: Insufficient documentation

## 2023-11-29 MED ORDER — IBUPROFEN 800 MG PO TABS
800.0000 mg | ORAL_TABLET | Freq: Once | ORAL | Status: AC
  Administered 2023-11-29: 800 mg via ORAL
  Filled 2023-11-29: qty 1

## 2023-11-29 NOTE — Discharge Instructions (Addendum)
 Evaluation today was overall reassuring.  It is possible he may have sustained a mild ankle sprain.  Recommend supportive treatment and ibuprofen.  Please follow-up with your primary care doctor.  You can use the ankle brace as need it.

## 2023-11-29 NOTE — ED Triage Notes (Signed)
 C/o R foot and R ankle pain x 3 days. Denies injury to area. Arrived using crutches.

## 2023-11-29 NOTE — ED Provider Notes (Signed)
  EMERGENCY DEPARTMENT AT [facility] Provider Note   CSN: [id number] Arrival date & time: 11/29/23  1336     Patient presents with: Foot Pain  HPI Gerald Colon is a 53 y.o. male presenting for right foot and ankle pain.  Started 3 days ago.  Denies trauma to the area.  Has been using crutches because the pain is intolerable.  Most of the pain is about the medial aspect of the ankle and then radiates to the back of his heel.  Worse with ambulation and movement.  Took Tylenol  last night for his pain.    Foot Pain       Prior to Admission medications   Medication Sig Start Date End Date Taking? Authorizing Provider  amLODipine -valsartan (EXFORGE) 10-320 MG tablet Take 1 tablet by mouth daily.    [provider]  cetirizine (ZYRTEC) 10 MG tablet Take 10 mg by mouth 2 (two) times daily.    [provider]  famotidine  (PEPCID ) 20 MG tablet TAKE 1 TABLET BY MOUTH TWICE A DAY 05/11/19   Kozlow, Camellia PARAS, MD  hydrochlorothiazide  (HYDRODIURIL ) 25 MG tablet Take 25 mg by mouth daily.    [provider]  hydrocortisone  2.5 % cream Apply topically 2 (two) times daily. 07/26/18   Kozlow, Camellia PARAS, MD  Vitamin D , Ergocalciferol , (DRISDOL ) 1.25 MG (50000 UNIT) CAPS capsule Take 1 capsule (50,000 Units total) by mouth every 7 (seven) days. 11/03/23   Verdon Parry D, MD  fluticasone  (FLONASE ) 50 MCG/ACT nasal spray Place 2 sprays into both nostrils daily. 06/13/14 06/27/14  Ward, Josette SAILOR, DO    Allergies: Patient has no known allergies.    Review of Systems See HPI  Updated Vital Signs BP (!) 145/83 (BP Location: Right Arm)   Pulse 79   Temp 98.7 F (37.1 C) (Oral)   Resp 18   SpO2 99%   Physical Exam Constitutional:      Appearance: Normal appearance.  HENT:     Head: Normocephalic.     Nose: Nose normal.  Eyes:     Conjunctiva/sclera: Conjunctivae normal.  Cardiovascular:     Pulses:          Dorsalis pedis pulses are 2+ on the right  side.  Pulmonary:     Effort: Pulmonary effort is normal.  Musculoskeletal:     Comments: No calf tenderness or swelling.  Feet:     Comments: Tenderness noted with palpation to the medial malleolus.  No notable swelling or erythema.  Both active flexion and extension of the ankle are limited due to pain. Neurological:     Mental Status: He is alert.  Psychiatric:        Mood and Affect: Mood normal.     (all labs ordered are listed, but only abnormal results are displayed) Labs Reviewed - No data to display  EKG: None  Radiology: DG Ankle Complete Right Result Date: 11/29/2023 EXAM: 3 OR MORE VIEW(S) XRAY OF THE ANKLE 11/29/2023 02:02:00 PM CLINICAL HISTORY: Pain. [employer] student performed; Pt states pain and swelling can't recall any injury states that it got worse after church on sunday COMPARISON: None available. FINDINGS: BONES AND JOINTS: No acute fracture. No focal osseous lesion. No joint dislocation. Mild medial and lateral osteoarthritis. SOFT TISSUES: Mild diffuse subcutaneous edema. Mild enthesopathic changes at Achilles tendon insertion. IMPRESSION: 1. No acute osseous abnormality. Electronically signed by: Norleen Boxer MD 11/29/2023 03:18 PM EDT RP Workstation: [id number]   DG Foot Complete Right Result  Date: 11/29/2023 EXAM: 3 OR MORE VIEW(S) XRAY OF THE FOOT 11/29/2023 02:02:00 PM COMPARISON: None available. CLINICAL HISTORY: Pain 144615. [employer] student performed; Pt states pain and swelling can't recall any injury states that it got worse after church on sunday. FINDINGS: BONES AND JOINTS: No acute fracture. No focal osseous lesion. No joint dislocation. SOFT TISSUES: Subcutaneous soft tissue edema of the ankle. IMPRESSION: 1. Subcutaneous soft tissue edema of the ankle. Electronically signed by: Norleen Boxer MD 11/29/2023 03:16 PM EDT RP Workstation: [id number]     Procedures   Medications Ordered in the ED  ibuprofen (ADVIL) tablet 800 mg (800 mg Oral Given 11/29/23  1523)                                    Medical Decision Making Amount and/or Complexity of Data Reviewed Radiology: ordered.  Risk Prescription drug management.   53 year old well-appearing male presenting for right ankle pain. Exam notable for tenderness about the right medial malleolus but otherwise unremarkable.  The ankle and foot does not appear to be infected. Also considered DVT but unlikely given that the pain seems to be localized around the ankle and he had no calf tenderness or swelling.  Suspect this could be a mild ankle sprain.  X-ray showed subcutaneous soft tissue edema around the ankle but no osseous abnormality.  Advised RICE treatment and NSAIDs.  Gave him a ankle brace for comfort.  Advised him to follow-up with his PCP.  Discussed return precautions.  Discharged.     Final diagnoses:  Acute right ankle pain    ED Discharge Orders     None          Lang Norleen POUR, PA-C 11/29/23 1622    Horton, Roxie HERO, DO 12/01/23 1514

## 2023-12-01 ENCOUNTER — Encounter (INDEPENDENT_AMBULATORY_CARE_PROVIDER_SITE_OTHER): Payer: Self-pay | Admitting: Family Medicine

## 2023-12-01 ENCOUNTER — Ambulatory Visit (INDEPENDENT_AMBULATORY_CARE_PROVIDER_SITE_OTHER): Admitting: Family Medicine

## 2023-12-01 VITALS — BP 118/79 | HR 82 | Temp 98.4°F | Ht 73.0 in | Wt >= 6400 oz

## 2023-12-01 DIAGNOSIS — N183 Chronic kidney disease, stage 3 unspecified: Secondary | ICD-10-CM

## 2023-12-01 DIAGNOSIS — E1122 Type 2 diabetes mellitus with diabetic chronic kidney disease: Secondary | ICD-10-CM | POA: Diagnosis not present

## 2023-12-01 DIAGNOSIS — E1169 Type 2 diabetes mellitus with other specified complication: Secondary | ICD-10-CM

## 2023-12-01 DIAGNOSIS — N2889 Other specified disorders of kidney and ureter: Secondary | ICD-10-CM | POA: Insufficient documentation

## 2023-12-01 DIAGNOSIS — G4733 Obstructive sleep apnea (adult) (pediatric): Secondary | ICD-10-CM

## 2023-12-01 DIAGNOSIS — E66813 Obesity, class 3: Secondary | ICD-10-CM

## 2023-12-01 DIAGNOSIS — N1831 Chronic kidney disease, stage 3a: Secondary | ICD-10-CM | POA: Diagnosis not present

## 2023-12-01 DIAGNOSIS — I129 Hypertensive chronic kidney disease with stage 1 through stage 4 chronic kidney disease, or unspecified chronic kidney disease: Secondary | ICD-10-CM | POA: Diagnosis not present

## 2023-12-01 DIAGNOSIS — E559 Vitamin D deficiency, unspecified: Secondary | ICD-10-CM | POA: Diagnosis not present

## 2023-12-01 DIAGNOSIS — Z6841 Body Mass Index (BMI) 40.0 and over, adult: Secondary | ICD-10-CM

## 2023-12-01 MED ORDER — VITAMIN D (ERGOCALCIFEROL) 1.25 MG (50000 UNIT) PO CAPS: 50000.0000 [IU] | ORAL_CAPSULE | ORAL | 0 refills | Status: DC

## 2023-12-01 NOTE — Progress Notes (Signed)
 Office: [phone]  /  Fax: [phone]  WEIGHT SUMMARY AND BIOMETRICS  Anthropometric Measurements Height: 6' 1 (1.854 m) Weight: (!) 426 lb (193.2 kg) BMI (Calculated): 56.22 Weight at Last Visit: 442 lb Weight Lost Since Last Visit: 16 lb Weight Gained Since Last Visit: 0 Starting Weight: 454 lb Total Weight Loss (lbs): 28 lb (12.7 kg) Peak Weight: 455 lb   Body Composition  Body Fat %: 46.4 % Fat Mass (lbs): 197.8 lbs Muscle Mass (lbs): 217.4 lbs Total Body Water (lbs): 177.4 lbs Visceral Fat Rating : 38   Other Clinical Data Fasting: No Labs: No Today's Visit #: 10 Starting Date: 06/01/22    Chief Complaint: OBESITY    History of Present Illness Gerald Colon is a 53 year old male with obesity, type 2 diabetes, and obstructive sleep apnea who presents for obesity treatment plan assessment and progress evaluation.  He is adhering to the category four eating plan about sixty percent of the time, focusing on increasing his intake of fruits, vegetables, and protein. He maintains adequate hydration but skips meals. He generally gets seven to nine hours of sleep most nights but is not currently exercising. He has lost sixteen pounds in the last month. He loosely follows an intermittent fasting schedule, not eating between midnight and noon, and has been more mindful of including vegetables in his meals.  He is being treated for vitamin D  deficiency with prescription ergocalciferol , 50,000 IU per week, and requests a refill. He was diagnosed with type 2 diabetes in April with a hemoglobin A1c of 6.5, which improved to 6.2 by August through lifestyle modifications, including weight loss. He is not currently on any diabetes-specific medication.  He was diagnosed with moderate obstructive sleep apnea last year via a home sleep study and is working on weight loss to manage this condition. He is not using a CPAP machine.  He has a history of kidney issues and is seeing  a nephrologist. His creatinine and GFR levels indicate kidney concerns. He has experienced some improvement in kidney function over the past year.  His cousin experienced complications from diabetes, which motivated him to manage his condition better. He has been trying to control his snack intake and has reduced eating out, opting for healthier options when he does. No current exercise, adequate sleep, and no significant hunger despite meal timing adjustments.      PHYSICAL EXAM:  Blood pressure 118/79, pulse 82, temperature 98.4 F (36.9 C), height 6' 1 (1.854 m), weight (!) 426 lb (193.2 kg), SpO2 97%. Body mass index is 56.2 kg/m.  DIAGNOSTIC DATA REVIEWED:  BMET    Component Value Date/Time   NA 140 09/08/2023 1309   K 4.2 09/08/2023 1309   CL 100 09/08/2023 1309   CO2 23 09/08/2023 1309   GLUCOSE 99 09/08/2023 1309   GLUCOSE 110 (H) 04/21/2015 0708   BUN 25 (H) 09/08/2023 1309   CREATININE 1.65 (H) 09/08/2023 1309   CALCIUM 9.6 09/08/2023 1309   GFRNONAA 70 12/16/2016 1023   GFRAA 81 12/16/2016 1023   Lab Results  Component Value Date   HGBA1C 6.2 (H) 09/08/2023   HGBA1C 6.5 (H) 06/01/2023   Lab Results  Component Value Date   INSULIN  29.6 (H) 09/08/2023   INSULIN  53.7 (H) 06/01/2023   Lab Results  Component Value Date   TSH 0.891 06/01/2023   CBC    Component Value Date/Time   WBC 6.1 06/01/2023 1008   WBC 7.4 05/24/2015 0625  RBC 5.21 06/01/2023 1008   RBC 5.15 05/24/2015 0625   HGB 14.5 06/01/2023 1008   HCT 44.2 06/01/2023 1008   PLT 246 06/01/2023 1008   MCV 85 06/01/2023 1008   MCH 27.8 06/01/2023 1008   MCH 27.4 05/24/2015 0625   MCHC 32.8 06/01/2023 1008   MCHC 32.1 05/24/2015 0625   RDW 13.2 06/01/2023 1008   Iron Studies    Component Value Date/Time   IRON 76 06/01/2023 1008   TIBC 266 06/01/2023 1008   FERRITIN 191 06/01/2023 1008   IRONPCTSAT 29 06/01/2023 1008   Lipid Panel     Component Value Date/Time   CHOL 213 (H)  09/08/2023 1309   TRIG 86 09/08/2023 1309   HDL 45 09/08/2023 1309   LDLCALC 153 (H) 09/08/2023 1309   Hepatic Function Panel     Component Value Date/Time   PROT 6.9 09/08/2023 1309   ALBUMIN 4.3 09/08/2023 1309   AST 18 09/08/2023 1309   ALT 24 09/08/2023 1309   ALKPHOS 56 09/08/2023 1309   BILITOT 0.9 09/08/2023 1309      Component Value Date/Time   TSH 0.891 06/01/2023 1008   Nutritional Lab Results  Component Value Date   VD25OH 40.0 09/08/2023   VD25OH 28.4 (L) 06/01/2023     Assessment and Plan Assessment & Plan Obesity, class 3 Obesity, class 3 with significant weight loss of 16 pounds in the last month, primarily from fat loss. Following the category four eating plan 60% of the time, focusing on protein and vegetables. Engaging in intermittent fasting by not eating between midnight and noon. Not currently exercising. Skipping meals occasionally, which may temporarily increase weight loss but could lower metabolism over time. Hunger is well controlled, and muscle mass is maintained, indicating fat loss. - Continue category four eating plan with emphasis on protein and vegetables - Encourage consistent meal timing to avoid metabolism drop  Type 2 diabetes mellitus, well controlled Type 2 diabetes mellitus diagnosed in April with an initial A1c of 6.5, now improved to 6.2. Well controlled with lifestyle modifications including weight loss. Discussed the importance of maintaining A1c in the low sixes to prevent complications such as heart disease, kidney damage, and vision problems. Explained that maintaining A1c in the low sixes reduces the risk of complications, and that diabetes is considered in remission rather than cured. - Check A1c before the end of the year  Chronic kidney disease, stage 3 Chronic kidney disease, stage 3, likely related to previous use of cyclosporin and hypertension. Current GFR is 49. Weight loss is encouraged to reduce kidney stress. Slight  improvement noted over the last year. Dehydration may exacerbate kidney issues. Discussed the importance of maintaining current kidney function to avoid progression to more severe stages. - Continue weight loss efforts to reduce kidney stress - Ensure adequate hydration to prevent further kidney damage  Obstructive sleep apnea, moderate Moderate obstructive sleep apnea diagnosed last year. Not currently using CPAP. Weight loss is being pursued as a treatment strategy. - Continue weight loss efforts to improve sleep apnea  Vitamin D  deficiency Vitamin D  deficiency currently managed with ergocalciferol  50,000 IU weekly. - Refill ergocalciferol  50,000 IU weekly prescription     I personally spent a total of 40 minutes in the care of the patient today including preparing to see the patient, performing a medically appropriate evaluation of current problems, placing orders in the EMR, documenting clinical information in the EMR, customized nutritional counseling for their specific health and social needs, and  discussing results with the patient and educating them on how these results can affect their health and weight.  Raven was counseled on the importance of maintaining healthy lifestyle habits, including balanced nutrition, regular physical activity, and behavioral modifications, while taking antiobesity medication.  Patient verbalized understanding that medication is an adjunct to, not a replacement for, lifestyle changes and that the long-term success and weight maintenance depend on continued adherence to these strategies.   Mahir was informed of the importance of frequent follow up visits to maximize his success with intensive lifestyle modifications for his obesity and obesity related health conditions as recommended by USPSTF and CMS guidelines   Louann Penton, MD

## 2023-12-28 ENCOUNTER — Ambulatory Visit (INDEPENDENT_AMBULATORY_CARE_PROVIDER_SITE_OTHER): Admitting: Physician Assistant

## 2023-12-29 ENCOUNTER — Ambulatory Visit (INDEPENDENT_AMBULATORY_CARE_PROVIDER_SITE_OTHER): Admitting: Family Medicine

## 2024-01-14 DIAGNOSIS — I10 Essential (primary) hypertension: Secondary | ICD-10-CM | POA: Diagnosis not present

## 2024-01-18 ENCOUNTER — Ambulatory Visit (INDEPENDENT_AMBULATORY_CARE_PROVIDER_SITE_OTHER): Admitting: Nurse Practitioner

## 2024-01-18 ENCOUNTER — Encounter (INDEPENDENT_AMBULATORY_CARE_PROVIDER_SITE_OTHER): Payer: Self-pay | Admitting: Nurse Practitioner

## 2024-01-18 VITALS — BP 114/70 | HR 87 | Temp 98.9°F | Ht 73.0 in | Wt >= 6400 oz

## 2024-01-18 DIAGNOSIS — E1165 Type 2 diabetes mellitus with hyperglycemia: Secondary | ICD-10-CM | POA: Diagnosis not present

## 2024-01-18 DIAGNOSIS — E1159 Type 2 diabetes mellitus with other circulatory complications: Secondary | ICD-10-CM | POA: Diagnosis not present

## 2024-01-18 DIAGNOSIS — E559 Vitamin D deficiency, unspecified: Secondary | ICD-10-CM

## 2024-01-18 DIAGNOSIS — I152 Hypertension secondary to endocrine disorders: Secondary | ICD-10-CM

## 2024-01-18 DIAGNOSIS — Z6841 Body Mass Index (BMI) 40.0 and over, adult: Secondary | ICD-10-CM

## 2024-01-18 DIAGNOSIS — E1169 Type 2 diabetes mellitus with other specified complication: Secondary | ICD-10-CM | POA: Diagnosis not present

## 2024-01-18 MED ORDER — VITAMIN D (ERGOCALCIFEROL) 1.25 MG (50000 UNIT) PO CAPS: 50000.0000 [IU] | ORAL_CAPSULE | ORAL | 1 refills | Status: DC

## 2024-01-18 NOTE — Patient Instructions (Signed)

## 2024-01-18 NOTE — Progress Notes (Signed)
 Office: [phone]  /  Fax: [phone]  WEIGHT SUMMARY AND BIOMETRICS  Weight Lost Since Last Visit: 0  Weight Gained Since Last Visit: 8 lb   Vitals Temp: 98.9 F (37.2 C) BP: 114/70 Pulse Rate: 87 SpO2: 97 %   Anthropometric Measurements Height: 6' 1 (1.854 m) Weight: (!) 434 lb (196.9 kg) BMI (Calculated): 57.27 Weight at Last Visit: 426 lb Weight Lost Since Last Visit: 0 Weight Gained Since Last Visit: 8 lb Starting Weight: 454 lb Total Weight Loss (lbs): 20 lb (9.072 kg) Peak Weight: 455 lb   Body Composition  Body Fat %: 48.4 % Fat Mass (lbs): 210.4 lbs Muscle Mass (lbs): 213.3 lbs Visceral Fat Rating : 40   Other Clinical Data Fasting: no Labs: no Today's Visit #: 11 Starting Date: 06/01/22    Bio Impedance Data reviewed with patient: Weight is up 8 pounds. Muscle mass decrease 4.1 pounds, adipose is up 12.6 pounds. Visceral fat rating increase by 2 points from 28 to 40.   HPI  Chief Complaint: OBESITY  Gerald Colon is here to discuss his progress with his obesity treatment plan. He is on the the Category 4 Plan and states he is following his eating plan approximately 80 % of the time. He states he is exercising 30 minutes 2-3 days per week.   Interval History:  He will still occasionally skip a meal and is usually breakfast. He drinks about 90-120 ounce of water a day.  He is trying to get 140 grams of protein a day.  He is doing good with fruits but has not been getting as many vegetables.  He did travel out of town over the holiday- ate out a lot. Knows he needs to get back on plan He has started walking at home 2-3 days a week for 20 minutes  Gerald Colon does have hypertension and his BP's are currently well controlled on Amlodipine /Valsartan 10/320 mg every day and hydrochlorothiazide  25 mg every day. Denies headaches, chest pain shortness of breath at rest and dizziness  BP Readings from Last 3 Encounters:  01/18/24 114/70  12/01/23 118/79   11/29/23 (!) 145/83   He does have Type 2 Diabetes and is currently on no medication. He is trying nutrition, exercise and weight loss to help improve glucose levels. His A1c was 6.5 06/01/23 and improved to 6.2 on 09/08/23.   He continues on Ergocalciferol  50000 units once a week for Vit D deficiency and denies side effects from medication.   PHYSICAL EXAM:  Blood pressure 114/70, pulse 87, temperature 98.9 F (37.2 C), height 6' 1 (1.854 m), weight (!) 434 lb (196.9 kg), SpO2 97%. Body mass index is 57.26 kg/m.  General: Well Developed, well nourished, and in no acute distress.  HEENT: Normocephalic, atraumatic; EOMI, sclerae are anicteric. Skin: Warm and dry, good turgor Chest:  Normal excursion, shape, no gross ABN Respiratory: No conversational dyspnea; speaking in full sentences NeuroM-Sk:  Normal gross ROM * 4 extremities  Psych: A and O X 3, insight adequate, mood- full    DIAGNOSTIC DATA REVIEWED:  BMET    Component Value Date/Time   NA 140 09/08/2023 1309   K 4.2 09/08/2023 1309   CL 100 09/08/2023 1309   CO2 23 09/08/2023 1309   GLUCOSE 99 09/08/2023 1309   GLUCOSE 110 (H) 04/21/2015 0708   BUN 25 (H) 09/08/2023 1309   CREATININE 1.65 (H) 09/08/2023 1309   CALCIUM 9.6 09/08/2023 1309   GFRNONAA 70 12/16/2016 1023   GFRAA 81 12/16/2016  1023   Lab Results  Component Value Date   HGBA1C 6.2 (H) 09/08/2023   HGBA1C 6.5 (H) 06/01/2023   Lab Results  Component Value Date   INSULIN  29.6 (H) 09/08/2023   INSULIN  53.7 (H) 06/01/2023   Lab Results  Component Value Date   TSH 0.891 06/01/2023   CBC    Component Value Date/Time   WBC 6.1 06/01/2023 1008   WBC 7.4 05/24/2015 0625   RBC 5.21 06/01/2023 1008   RBC 5.15 05/24/2015 0625   HGB 14.5 06/01/2023 1008   HCT 44.2 06/01/2023 1008   PLT 246 06/01/2023 1008   MCV 85 06/01/2023 1008   MCH 27.8 06/01/2023 1008   MCH 27.4 05/24/2015 0625   MCHC 32.8 06/01/2023 1008   MCHC 32.1 05/24/2015 0625   RDW  13.2 06/01/2023 1008   Iron Studies    Component Value Date/Time   IRON 76 06/01/2023 1008   TIBC 266 06/01/2023 1008   FERRITIN 191 06/01/2023 1008   IRONPCTSAT 29 06/01/2023 1008   Lipid Panel     Component Value Date/Time   CHOL 213 (H) 09/08/2023 1309   TRIG 86 09/08/2023 1309   HDL 45 09/08/2023 1309   LDLCALC 153 (H) 09/08/2023 1309   Hepatic Function Panel     Component Value Date/Time   PROT 6.9 09/08/2023 1309   ALBUMIN 4.3 09/08/2023 1309   AST 18 09/08/2023 1309   ALT 24 09/08/2023 1309   ALKPHOS 56 09/08/2023 1309   BILITOT 0.9 09/08/2023 1309      Component Value Date/Time   TSH 0.891 06/01/2023 1008   Nutritional Lab Results  Component Value Date   VD25OH 40.0 09/08/2023   VD25OH 28.4 (L) 06/01/2023     ASSESSMENT AND PLAN  Type 2 diabetes mellitus associated with morbid obesity (HCC) Class 3 severe obesity with serious comorbidity and body mass index (BMI) of 50.0 to 59.9 in adult, unspecified obesity type (HCC)  TREATMENT PLAN FOR OBESITY:  Recommended Dietary Goals  Gerald Colon is currently in the action stage of change. As such, his goal is to continue weight management plan. He has agreed to the Category 4 Plan.  Behavioral Intervention  We discussed the following Behavioral Modification Strategies today: increasing lean protein intake to established goals, avoiding skipping meals, staying on track while traveling and vacationing, celebration eating strategies, continue to work on maintaining a reduced calorie state, getting the recommended amount of protein, incorporating whole foods, making healthy choices, staying well hydrated and practicing mindfulness when eating., and increase protein intake, fibrous foods (25 grams per day for women, 30 grams for men) and water to improve satiety and decrease hunger signals. Gerald Colon  He wants to lose weight in December - He does  recognize that he must follow a structured plan and will eat before social  situation to meet his protein goals and minimze party foods - He will give away food gifts unless they are very low calories or high protein - He will avoid calorie-containing liquids such as holiday drinks, eggnog and alcohol - He will stick to her structured plan strictly most of the time - This strategy should help him lose 1-2 pounds in December   Recommended Physical Activity Goals  Gerald Colon has been advised to work up to 150 minutes of moderate intensity aerobic activity a week and strengthening exercises 2-3 times per week for cardiovascular health, weight loss maintenance and preservation of muscle mass.   He has agreed to Continue aerobic activity with a  goal of 150 minutes a week at moderate intensity.  and Combine aerobic and strengthening exercises for efficiency and improved cardiometabolic health.   Pharmacotherapy We discussed various medication options to help Gerald Colon with his weight loss efforts and we both agreed to continue nutrition and behavior modification.  Given information on GLP1's and did discuss the information.  Will read over and decide if he wishes to pursue at next visit.  ASSOCIATED CONDITIONS ADDRESSED TODAY  Action/Plan  Vitamin D  deficiency Continue to supplement with Ergocalciferol  50000 units once a week -     Vitamin D  (Ergocalciferol ); Take 1 capsule (50,000 Units total) by mouth every 7 (seven) days.  Dispense: 5 capsule; Refill: 1  Type 2 diabetes mellitus with hyperglycemia, without long-term current use of insulin  (HCC) Continue Category 4  meal plan, limit simple carbohydrates Discussed GLP1 medication and is given information to read over as option to help diabetes and weight management. Continue exercise with current goal of 150 minutes of moderate to high intensity exercise/week.  Continue to follow regularly with PCP  Hypertension associated with type 2 diabetes mellitus (HCC) Continue Amlodipine /Valsartan 10/320 mg every day and  hydrochlorothiazide  25 mg every day Continue Category 4 meal plan  and DASH diet Monitor BP and if consistently >140/90 notify PCP If develops headaches, chest pain, shortness of breath or dizziness go to ER Continue to follow regularly with PCP       Return in about 4 weeks (around 02/15/2024).Gerald Colon He was informed of the importance of frequent follow up visits to maximize his success with intensive lifestyle modifications for his multiple health conditions.   ATTESTASTION STATEMENTS:  Reviewed by clinician on day of visit: allergies, medications, problem list, medical history, surgical history, family history, social history, and previous encounter notes.     Desirai Traxler ANP-C

## 2024-02-07 ENCOUNTER — Ambulatory Visit (INDEPENDENT_AMBULATORY_CARE_PROVIDER_SITE_OTHER): Admitting: Family Medicine

## 2024-02-07 ENCOUNTER — Encounter (INDEPENDENT_AMBULATORY_CARE_PROVIDER_SITE_OTHER): Payer: Self-pay | Admitting: Family Medicine

## 2024-02-07 VITALS — BP 122/85 | HR 77 | Temp 97.8°F | Ht 73.0 in | Wt >= 6400 oz

## 2024-02-07 DIAGNOSIS — E669 Obesity, unspecified: Secondary | ICD-10-CM | POA: Diagnosis not present

## 2024-02-07 DIAGNOSIS — N1831 Chronic kidney disease, stage 3a: Secondary | ICD-10-CM | POA: Diagnosis not present

## 2024-02-07 DIAGNOSIS — Z6841 Body Mass Index (BMI) 40.0 and over, adult: Secondary | ICD-10-CM | POA: Diagnosis not present

## 2024-02-07 DIAGNOSIS — E1122 Type 2 diabetes mellitus with diabetic chronic kidney disease: Secondary | ICD-10-CM

## 2024-02-07 DIAGNOSIS — E559 Vitamin D deficiency, unspecified: Secondary | ICD-10-CM | POA: Diagnosis not present

## 2024-02-07 NOTE — Progress Notes (Signed)
 "  Office: [phone]  /  Fax: [phone]  WEIGHT SUMMARY AND BIOMETRICS  Anthropometric Measurements Height: 6' 1 (1.854 m) Weight: (!) 434 lb (196.9 kg) BMI (Calculated): 57.27 Weight at Last Visit: 434 lb Weight Lost Since Last Visit: 0 Weight Gained Since Last Visit: 0 Starting Weight: 454 lb Total Weight Loss (lbs): 20 lb (9.072 kg) Peak Weight: 455 lb   Body Composition  Body Fat %: 49.5 % Fat Mass (lbs): 215.2 lbs Muscle Mass (lbs): 209 lbs Visceral Fat Rating : 41   Other Clinical Data Fasting: no Labs: no Today's Visit #: 12 Starting Date: 06/01/22    Chief Complaint: OBESITY     History of Present Illness Gerald Colon is a 54 year old male with obesity and type two diabetes who presents for obesity treatment and progress assessment.  He is following a category four eating plan about fifty percent of the time, attempting to incorporate more fruits but struggling with vegetable intake. He is not consuming the recommended amount of protein but is trying to stay hydrated. He occasionally skips meals and has maintained his weight over the last month, including the holiday season. He is walking for twenty minutes two days a week.  He is managing type two diabetes with chronic renal impairment stage three A. He is not on any medications for diabetes but is focusing on diet, exercise, and weight loss to control his glucose levels. His most recent labs from August 2025 show an A1c of 6.2%.  During the holiday season, he indulged in some foods like a blueberry cream pie, which included powdered sugar, cream cheese, and whipped cream. He acknowledges missing the mark on vegetable intake, consuming more potatoes and snacks during the holidays. He has been traveling to visit his uncle in the hospital, leading to some fast food consumption, but he has tried to make healthier choices when possible.  He is aware of the need to increase his fiber intake and improve  consistency in his walking routine. 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  He has vitamin D  deficiency and is on prescription vitamin D , fifty thousand international units per week, which he has not yet picked up.      PHYSICAL EXAM:  Blood pressure 122/85, pulse 77, temperature 97.8 F (36.6 C), height 6' 1 (1.854 m), weight (!) 434 lb (196.9 kg), SpO2 95%. Body mass index is 57.26 kg/m.  DIAGNOSTIC DATA REVIEWED:  BMET    Component Value Date/Time   NA 140 09/08/2023 1309   K 4.2 09/08/2023 1309   CL 100 09/08/2023 1309   CO2 23 09/08/2023 1309   GLUCOSE 99 09/08/2023 1309   GLUCOSE 110 (H) 04/21/2015 0708   BUN 25 (H) 09/08/2023 1309   CREATININE 1.65 (H) 09/08/2023 1309   CALCIUM 9.6 09/08/2023 1309   GFRNONAA 70 12/16/2016 1023   GFRAA 81 12/16/2016 1023   Lab Results  Component Value Date   HGBA1C 6.2 (H) 09/08/2023   HGBA1C 6.5 (H) 06/01/2023   Lab Results  Component Value Date   INSULIN  29.6 (H) 09/08/2023   INSULIN  53.7 (H) 06/01/2023   Lab Results  Component Value Date   TSH 0.891 06/01/2023   CBC    Component Value Date/Time   WBC 6.1 06/01/2023 1008   WBC 7.4 05/24/2015 0625   RBC 5.21 06/01/2023 1008   RBC 5.15  05/24/2015 0625   HGB 14.5 06/01/2023 1008   HCT 44.2 06/01/2023 1008   PLT 246 06/01/2023 1008   MCV 85 06/01/2023 1008   MCH 27.8 06/01/2023 1008   MCH 27.4 05/24/2015 0625   MCHC 32.8 06/01/2023 1008   MCHC 32.1 05/24/2015 0625   RDW 13.2 06/01/2023 1008   Iron Studies    Component Value Date/Time   IRON 76 06/01/2023 1008   TIBC 266 06/01/2023 1008   FERRITIN 191 06/01/2023 1008   IRONPCTSAT 29 06/01/2023 1008   Lipid Panel     Component Value Date/Time   CHOL 213 (H) 09/08/2023 1309   TRIG 86 09/08/2023 1309   HDL 45 09/08/2023 1309   LDLCALC 153 (H)  09/08/2023 1309   Hepatic Function Panel     Component Value Date/Time   PROT 6.9 09/08/2023 1309   ALBUMIN 4.3 09/08/2023 1309   AST 18 09/08/2023 1309   ALT 24 09/08/2023 1309   ALKPHOS 56 09/08/2023 1309   BILITOT 0.9 09/08/2023 1309      Component Value Date/Time   TSH 0.891 06/01/2023 1008   Nutritional Lab Results  Component Value Date   VD25OH 40.0 09/08/2023   VD25OH 28.4 (L) 06/01/2023     Assessment and Plan Assessment & Plan Obesity Management is ongoing with a focus on dietary adherence and physical activity. He has been following the category four eating plan about 50% of the time, with challenges in vegetable intake and protein consumption. He maintains weight despite holiday indulgences and has lost 20 pounds since April. He is considering alternative dietary plans, such as a low-carb plan, to address boredom with the current regimen. Emphasis is placed on sustainable lifestyle changes rather than extreme measures. - Continue category four eating plan with focus on meal planning and prepping. - Consider low-carb plan for dinner to increase variety. - Encouraged consistent physical activity, aiming for daily walking. - Scheduled follow-up appointment in three weeks.  Type 2 diabetes mellitus with stage 3A chronic kidney disease Type 2 diabetes is managed through diet, exercise, and weight loss. Recent A1c is 6.2, indicating good control. He is not on medication and is focusing on lifestyle modifications to manage glucose levels. He is aware of the importance of protein and fiber intake to support glucose control and prevent insulin  resistance. - Continue current dietary and exercise regimen to manage glucose levels.  Vitamin D  deficiency Managed with prescription vitamin D , 50,000 IU weekly. He has not picked up the prescription recently but is aware of the need to continue supplementation. - Continue prescription vitamin D , 50,000 IU weekly. - Ensure  prescription is picked up and taken as directed.      Patients who are on anti-obesity medications are counseled on the importance of maintaining healthy lifestyle habits, including balanced nutrition, regular physical activity, and behavioral modifications,  Medication is an adjunct to, not a replacement for, lifestyle changes and that the long-term success and weight maintenance depend on continued adherence to these strategies.   Lucus was informed of the importance of frequent follow up visits to maximize his success with intensive lifestyle modifications for his obesity and obesity related health conditions as recommended by USPSTF and CMS guidelines   Louann Penton, MD   "

## 2024-03-02 ENCOUNTER — Ambulatory Visit (INDEPENDENT_AMBULATORY_CARE_PROVIDER_SITE_OTHER): Admitting: Family Medicine

## 2024-03-02 ENCOUNTER — Encounter (INDEPENDENT_AMBULATORY_CARE_PROVIDER_SITE_OTHER): Payer: Self-pay | Admitting: Family Medicine

## 2024-03-02 VITALS — BP 120/77 | HR 86 | Temp 98.3°F | Ht 73.0 in | Wt >= 6400 oz

## 2024-03-02 DIAGNOSIS — Z6841 Body Mass Index (BMI) 40.0 and over, adult: Secondary | ICD-10-CM

## 2024-03-02 DIAGNOSIS — E1159 Type 2 diabetes mellitus with other circulatory complications: Secondary | ICD-10-CM

## 2024-03-02 DIAGNOSIS — E559 Vitamin D deficiency, unspecified: Secondary | ICD-10-CM

## 2024-03-02 DIAGNOSIS — E669 Obesity, unspecified: Secondary | ICD-10-CM | POA: Diagnosis not present

## 2024-03-02 DIAGNOSIS — N1831 Chronic kidney disease, stage 3a: Secondary | ICD-10-CM | POA: Diagnosis not present

## 2024-03-02 DIAGNOSIS — I129 Hypertensive chronic kidney disease with stage 1 through stage 4 chronic kidney disease, or unspecified chronic kidney disease: Secondary | ICD-10-CM | POA: Diagnosis not present

## 2024-03-02 DIAGNOSIS — I152 Hypertension secondary to endocrine disorders: Secondary | ICD-10-CM

## 2024-03-02 DIAGNOSIS — E1122 Type 2 diabetes mellitus with diabetic chronic kidney disease: Secondary | ICD-10-CM

## 2024-03-02 MED ORDER — VITAMIN D (ERGOCALCIFEROL) 1.25 MG (50000 UNIT) PO CAPS: 50000.0000 [IU] | ORAL_CAPSULE | ORAL | 1 refills | Status: AC

## 2024-03-02 NOTE — Progress Notes (Signed)
 "  Office: [phone]  /  Fax: [phone]  WEIGHT SUMMARY AND BIOMETRICS  Anthropometric Measurements Height: 6' 1 (1.854 m) Weight: (!) 433 lb (196.4 kg) BMI (Calculated): 57.14 Weight at Last Visit: 434 lb Weight Lost Since Last Visit: 1 lb Weight Gained Since Last Visit: 0 Starting Weight: 454 lb Total Weight Loss (lbs): 21 lb (9.526 kg) Peak Weight: 455 lb   Body Composition  Body Fat %: 47.9 % Fat Mass (lbs): 207.6 lbs Muscle Mass (lbs): 214.6 lbs Total Body Water (lbs): 180.8 lbs Visceral Fat Rating : 39   Other Clinical Data Fasting: no Labs: no Today's Visit #: 13 Starting Date: 06/01/22    Chief Complaint: OBESITY   History of Present Illness Gerald Colon is a 54 year old male with obesity, vitamin D  deficiency, type 2 diabetes, and renal insufficiency who presents for obesity treatment and progress assessment.  He is adhering to a category four eating plan approximately ninety percent of the time, emphasizing protein intake, fruits, vegetables, hydration, and regular meals. He engages in walking two to three days a week for fifteen to thirty minutes. Despite these efforts, he has lost only one pound over the last three weeks, which he finds disappointing. He manages sweet cravings with chia seed and Greek yogurt pudding, aiming to leverage its fiber and protein content for weight loss.  He is being treated for vitamin D  deficiency with prescription ergocalciferol , fifty thousand international units per week, and requests a refill.  His type 2 diabetes is managed with lifestyle modifications, focusing on reducing simple carbohydrates, increasing exercise, and weight loss. He is not on any diabetic medications. His last hemoglobin A1c, done in August 2025, was well controlled at 6.2.  He has renal insufficiency with a GFR of 49. He is working on tight blood pressure control and reducing sodium intake. He takes hydrochlorothiazide  25 mg daily and amlodipine   valsartan 10/320 mg daily.  He reports a decrease in muscle mass since October. He is concerned about his visceral fat levels, which he would like to reduce further. 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      PHYSICAL EXAM:  Blood pressure 120/77, pulse 86, temperature 98.3 F (36.8 C), height 6' 1 (1.854 m), weight (!) 433 lb (196.4 kg), SpO2 95%. Body mass index is 57.13 kg/m.  DIAGNOSTIC DATA REVIEWED BY MYSELF TODAY:  BMET    Component Value Date/Time   NA 140 09/08/2023 1309   K 4.2 09/08/2023 1309   CL 100 09/08/2023 1309   CO2 23 09/08/2023 1309   GLUCOSE 99 09/08/2023 1309   GLUCOSE 110 (H) 04/21/2015 0708   BUN 25 (H) 09/08/2023 1309   CREATININE 1.65 (H) 09/08/2023 1309   CALCIUM 9.6 09/08/2023 1309   GFRNONAA 70 12/16/2016 1023   GFRAA 81 12/16/2016 1023   Lab Results  Component Value Date   HGBA1C 6.2 (H) 09/08/2023   HGBA1C 6.5 (H) 06/01/2023   Lab Results  Component Value Date   INSULIN  29.6 (H) 09/08/2023   INSULIN  53.7 (H) 06/01/2023   Lab Results  Component Value Date   TSH 0.891 06/01/2023   CBC    Component Value Date/Time   WBC 6.1 06/01/2023 1008   WBC 7.4 05/24/2015 0625   RBC 5.21 06/01/2023 1008   RBC 5.15 05/24/2015 0625   HGB 14.5 06/01/2023 1008   HCT 44.2 06/01/2023 1008   PLT 246 06/01/2023 1008  MCV 85 06/01/2023 1008   MCH 27.8 06/01/2023 1008   MCH 27.4 05/24/2015 0625   MCHC 32.8 06/01/2023 1008   MCHC 32.1 05/24/2015 0625   RDW 13.2 06/01/2023 1008   Iron Studies    Component Value Date/Time   IRON 76 06/01/2023 1008   TIBC 266 06/01/2023 1008   FERRITIN 191 06/01/2023 1008   IRONPCTSAT 29 06/01/2023 1008   Lipid Panel     Component Value Date/Time   CHOL 213 (H) 09/08/2023 1309   TRIG 86 09/08/2023 1309   HDL 45 09/08/2023 1309   LDLCALC 153 (H) 09/08/2023 1309   Hepatic Function Panel     Component Value Date/Time    PROT 6.9 09/08/2023 1309   ALBUMIN 4.3 09/08/2023 1309   AST 18 09/08/2023 1309   ALT 24 09/08/2023 1309   ALKPHOS 56 09/08/2023 1309   BILITOT 0.9 09/08/2023 1309      Component Value Date/Time   TSH 0.891 06/01/2023 1008   Nutritional Lab Results  Component Value Date   VD25OH 40.0 09/08/2023   VD25OH 28.4 (L) 06/01/2023     Assessment and Plan Assessment & Plan Obesity Management with a focus on dietary modifications and exercise. He has been following a category four eating plan with 90% adherence, increasing protein intake, and consuming more fruits and vegetables. He has been walking 2-3 days a week for 15-30 minutes. Despite these efforts, he has only lost 1 pound over the last three weeks. He is considering a low carbohydrate plan to jumpstart weight loss, which involves strict adherence to a specific food list to promote fat burning by reducing insulin  response. Muscle strengthening exercises are recommended to improve metabolism. He is open to considering medication options for weight loss and blood sugar control if needed. - Continue category four eating plan or consider low carbohydrate plan for short-term weight loss. - Incorporate muscle strengthening exercises such as body weight exercises or weight lifting. - Will discuss potential medication options for weight loss and blood sugar control if needed.  Type 2 diabetes mellitus with stage 3 chronic kidney disease and HTN Type 2 diabetes is managed with lifestyle modifications, including decreased simple carbohydrates, increased exercise, and weight loss. He is not on any diabetic medications. His most recent hemoglobin A1c was 6.2 in August 2025, indicating well-controlled diabetes. He has stage 3 chronic kidney disease with a GFR of 49. Blood pressure is well controlled at 120/77 with hydrochlorothiazide  and amlodipine  valsartan. - Continue lifestyle modifications for diabetes management. - Maintain tight blood  pressure control with current medications.  Vitamin D  deficiency Managed with prescription ergocalciferol  50,000 IU weekly. He requests a refill. - Refilled ergocalciferol  50,000 IU weekly prescription.      Patients who are on anti-obesity medications are counseled on the importance of maintaining healthy lifestyle habits, including balanced nutrition, regular physical activity, and behavioral modifications,  Medication is an adjunct to, not a replacement for, lifestyle changes and that the long-term success and weight maintenance depend on continued adherence to these strategies.   Ralph was informed of the importance of frequent follow up visits to maximize his success with intensive lifestyle modifications for his obesity and obesity related health conditions as recommended by USPSTF and CMS guidelines   Louann Penton, MD   "

## 2024-03-23 ENCOUNTER — Ambulatory Visit (INDEPENDENT_AMBULATORY_CARE_PROVIDER_SITE_OTHER): Admitting: Family Medicine

## 2024-04-12 ENCOUNTER — Ambulatory Visit (INDEPENDENT_AMBULATORY_CARE_PROVIDER_SITE_OTHER): Admitting: Family Medicine
